# Patient Record
Sex: Female | Born: 1935 | Race: White | Hispanic: No | Smoking: Former smoker
Health system: Southern US, Community
[De-identification: ages and names within clinical notes are randomized; demographics above are authoritative.]

## PROBLEM LIST (undated history)

## (undated) DIAGNOSIS — F3289 Other specified depressive episodes: Secondary | ICD-10-CM

## (undated) DIAGNOSIS — F17200 Nicotine dependence, unspecified, uncomplicated: Secondary | ICD-10-CM

## (undated) DIAGNOSIS — I499 Cardiac arrhythmia, unspecified: Secondary | ICD-10-CM

## (undated) DIAGNOSIS — J441 Chronic obstructive pulmonary disease with (acute) exacerbation: Secondary | ICD-10-CM

## (undated) DIAGNOSIS — E319 Polyglandular dysfunction, unspecified: Secondary | ICD-10-CM

## (undated) DIAGNOSIS — K3189 Other diseases of stomach and duodenum: Secondary | ICD-10-CM

## (undated) DIAGNOSIS — J438 Other emphysema: Secondary | ICD-10-CM

## (undated) DIAGNOSIS — I10 Essential (primary) hypertension: Secondary | ICD-10-CM

## (undated) DIAGNOSIS — F0280 Dementia in other diseases classified elsewhere without behavioral disturbance: Secondary | ICD-10-CM

## (undated) DIAGNOSIS — E785 Hyperlipidemia, unspecified: Secondary | ICD-10-CM

## (undated) DIAGNOSIS — G309 Alzheimer's disease, unspecified: Secondary | ICD-10-CM

## (undated) DIAGNOSIS — F329 Major depressive disorder, single episode, unspecified: Secondary | ICD-10-CM

## (undated) DIAGNOSIS — M1990 Unspecified osteoarthritis, unspecified site: Secondary | ICD-10-CM

## (undated) DIAGNOSIS — I509 Heart failure, unspecified: Secondary | ICD-10-CM

## (undated) DIAGNOSIS — N183 Chronic kidney disease, stage 3 unspecified: Secondary | ICD-10-CM

## (undated) DIAGNOSIS — G4700 Insomnia, unspecified: Secondary | ICD-10-CM

## (undated) DIAGNOSIS — M810 Age-related osteoporosis without current pathological fracture: Secondary | ICD-10-CM

## (undated) DIAGNOSIS — I129 Hypertensive chronic kidney disease with stage 1 through stage 4 chronic kidney disease, or unspecified chronic kidney disease: Secondary | ICD-10-CM

## (undated) DIAGNOSIS — L821 Other seborrheic keratosis: Secondary | ICD-10-CM

## (undated) DIAGNOSIS — J029 Acute pharyngitis, unspecified: Secondary | ICD-10-CM

## (undated) DIAGNOSIS — R269 Unspecified abnormalities of gait and mobility: Secondary | ICD-10-CM

## (undated) DIAGNOSIS — K219 Gastro-esophageal reflux disease without esophagitis: Secondary | ICD-10-CM

## (undated) DIAGNOSIS — R55 Syncope and collapse: Secondary | ICD-10-CM

## (undated) DIAGNOSIS — R1013 Epigastric pain: Secondary | ICD-10-CM

## (undated) DIAGNOSIS — J449 Chronic obstructive pulmonary disease, unspecified: Secondary | ICD-10-CM

## (undated) DIAGNOSIS — J4489 Other specified chronic obstructive pulmonary disease: Secondary | ICD-10-CM

## (undated) DIAGNOSIS — H409 Unspecified glaucoma: Secondary | ICD-10-CM

## (undated) DIAGNOSIS — E876 Hypokalemia: Secondary | ICD-10-CM

## (undated) DIAGNOSIS — I4891 Unspecified atrial fibrillation: Secondary | ICD-10-CM

## (undated) DIAGNOSIS — I959 Hypotension, unspecified: Secondary | ICD-10-CM

## (undated) HISTORY — DX: Acute pharyngitis, unspecified: J02.9

## (undated) HISTORY — DX: Essential (primary) hypertension: I10

## (undated) HISTORY — DX: Hypertensive chronic kidney disease with stage 1 through stage 4 chronic kidney disease, or unspecified chronic kidney disease: I12.9

## (undated) HISTORY — DX: Dementia in other diseases classified elsewhere, unspecified severity, without behavioral disturbance, psychotic disturbance, mood disturbance, and anxiety: F02.80

## (undated) HISTORY — DX: Syncope and collapse: R55

## (undated) HISTORY — DX: Age-related osteoporosis without current pathological fracture: M81.0

## (undated) HISTORY — DX: Chronic obstructive pulmonary disease, unspecified: J44.9

## (undated) HISTORY — DX: Other specified depressive episodes: F32.89

## (undated) HISTORY — DX: Other emphysema: J43.8

## (undated) HISTORY — DX: Chronic obstructive pulmonary disease with (acute) exacerbation: J44.1

## (undated) HISTORY — DX: Cardiac arrhythmia, unspecified: I49.9

## (undated) HISTORY — DX: Insomnia, unspecified: G47.00

## (undated) HISTORY — DX: Unspecified atrial fibrillation: I48.91

## (undated) HISTORY — DX: Other specified chronic obstructive pulmonary disease: J44.89

## (undated) HISTORY — DX: Hypotension, unspecified: I95.9

## (undated) HISTORY — DX: Chronic kidney disease, stage 3 (moderate): N18.3

## (undated) HISTORY — DX: Chronic kidney disease, stage 3 unspecified: N18.30

## (undated) HISTORY — DX: Major depressive disorder, single episode, unspecified: F32.9

## (undated) HISTORY — DX: Epigastric pain: R10.13

## (undated) HISTORY — DX: Unspecified abnormalities of gait and mobility: R26.9

## (undated) HISTORY — DX: Hypokalemia: E87.6

## (undated) HISTORY — DX: Alzheimer's disease, unspecified: G30.9

## (undated) HISTORY — DX: Unspecified glaucoma: H40.9

## (undated) HISTORY — DX: Nicotine dependence, unspecified, uncomplicated: F17.200

## (undated) HISTORY — DX: Other diseases of stomach and duodenum: K31.89

## (undated) HISTORY — DX: Other seborrheic keratosis: L82.1

## (undated) HISTORY — DX: Polyglandular dysfunction, unspecified: E31.9

## (undated) HISTORY — DX: Gastro-esophageal reflux disease without esophagitis: K21.9

## (undated) HISTORY — DX: Hyperlipidemia, unspecified: E78.5

## (undated) HISTORY — DX: Unspecified osteoarthritis, unspecified site: M19.90

## (undated) HISTORY — DX: Heart failure, unspecified: I50.9

---

## 1998-07-18 ENCOUNTER — Ambulatory Visit (HOSPITAL_COMMUNITY): Admission: RE | Admit: 1998-07-18 | Discharge: 1998-07-18 | Payer: Self-pay

## 1998-07-25 ENCOUNTER — Ambulatory Visit (HOSPITAL_COMMUNITY): Admission: RE | Admit: 1998-07-25 | Discharge: 1998-07-25 | Payer: Self-pay

## 1999-07-25 ENCOUNTER — Ambulatory Visit (HOSPITAL_COMMUNITY): Admission: RE | Admit: 1999-07-25 | Discharge: 1999-07-25 | Payer: Self-pay

## 1999-07-30 ENCOUNTER — Ambulatory Visit (HOSPITAL_COMMUNITY): Admission: RE | Admit: 1999-07-30 | Discharge: 1999-07-30 | Payer: Self-pay

## 2000-07-28 ENCOUNTER — Encounter: Admission: RE | Admit: 2000-07-28 | Discharge: 2000-07-28 | Payer: Self-pay | Admitting: Internal Medicine

## 2000-07-28 ENCOUNTER — Encounter: Payer: Self-pay | Admitting: Internal Medicine

## 2001-07-29 ENCOUNTER — Ambulatory Visit (HOSPITAL_COMMUNITY): Admission: RE | Admit: 2001-07-29 | Discharge: 2001-07-29 | Payer: Self-pay | Admitting: *Deleted

## 2002-08-03 ENCOUNTER — Ambulatory Visit (HOSPITAL_COMMUNITY): Admission: RE | Admit: 2002-08-03 | Discharge: 2002-08-03 | Payer: Self-pay | Admitting: Internal Medicine

## 2003-08-05 ENCOUNTER — Ambulatory Visit (HOSPITAL_COMMUNITY): Admission: RE | Admit: 2003-08-05 | Discharge: 2003-08-05 | Payer: Self-pay | Admitting: *Deleted

## 2004-08-06 ENCOUNTER — Ambulatory Visit (HOSPITAL_COMMUNITY): Admission: RE | Admit: 2004-08-06 | Discharge: 2004-08-06 | Payer: Self-pay | Admitting: *Deleted

## 2005-08-08 ENCOUNTER — Ambulatory Visit (HOSPITAL_COMMUNITY): Admission: RE | Admit: 2005-08-08 | Discharge: 2005-08-08 | Payer: Self-pay | Admitting: Internal Medicine

## 2006-08-11 ENCOUNTER — Ambulatory Visit (HOSPITAL_COMMUNITY): Admission: RE | Admit: 2006-08-11 | Discharge: 2006-08-11 | Payer: Self-pay | Admitting: Internal Medicine

## 2007-08-19 ENCOUNTER — Ambulatory Visit (HOSPITAL_COMMUNITY): Admission: RE | Admit: 2007-08-19 | Discharge: 2007-08-19 | Payer: Self-pay | Admitting: Internal Medicine

## 2008-08-22 ENCOUNTER — Ambulatory Visit (HOSPITAL_COMMUNITY): Admission: RE | Admit: 2008-08-22 | Discharge: 2008-08-22 | Payer: Self-pay | Admitting: Internal Medicine

## 2009-08-25 ENCOUNTER — Ambulatory Visit (HOSPITAL_COMMUNITY): Admission: RE | Admit: 2009-08-25 | Discharge: 2009-08-25 | Payer: Self-pay | Admitting: Internal Medicine

## 2010-08-27 ENCOUNTER — Ambulatory Visit (HOSPITAL_COMMUNITY): Admission: RE | Admit: 2010-08-27 | Discharge: 2010-08-27 | Payer: Self-pay | Admitting: Internal Medicine

## 2011-01-19 ENCOUNTER — Other Ambulatory Visit (HOSPITAL_COMMUNITY): Payer: Self-pay | Admitting: Internal Medicine

## 2011-01-19 DIAGNOSIS — Z1231 Encounter for screening mammogram for malignant neoplasm of breast: Secondary | ICD-10-CM

## 2011-01-19 DIAGNOSIS — Z0000 Encounter for general adult medical examination without abnormal findings: Secondary | ICD-10-CM

## 2011-08-29 ENCOUNTER — Ambulatory Visit (HOSPITAL_COMMUNITY): Payer: Self-pay

## 2011-09-05 ENCOUNTER — Ambulatory Visit (HOSPITAL_COMMUNITY): Admission: RE | Admit: 2011-09-05 | Payer: PRIVATE HEALTH INSURANCE | Source: Ambulatory Visit

## 2012-11-05 ENCOUNTER — Emergency Department: Payer: Self-pay | Admitting: Emergency Medicine

## 2012-11-05 LAB — URINALYSIS, COMPLETE
Glucose,UR: NEGATIVE mg/dL (ref 0–75)
Ketone: NEGATIVE
Leukocyte Esterase: NEGATIVE
Nitrite: NEGATIVE
Ph: 5 (ref 4.5–8.0)
Protein: NEGATIVE
RBC,UR: 2 /HPF (ref 0–5)
Specific Gravity: 1.026 (ref 1.003–1.030)
Squamous Epithelial: 1
WBC UR: 3 /HPF (ref 0–5)

## 2012-11-05 LAB — COMPREHENSIVE METABOLIC PANEL
Albumin: 3.3 g/dL — ABNORMAL LOW (ref 3.4–5.0)
Anion Gap: 7 (ref 7–16)
Calcium, Total: 9.5 mg/dL (ref 8.5–10.1)
Chloride: 101 mmol/L (ref 98–107)
EGFR (African American): 37 — ABNORMAL LOW
SGOT(AST): 22 U/L (ref 15–37)
SGPT (ALT): 18 U/L (ref 12–78)
Total Protein: 7.1 g/dL (ref 6.4–8.2)

## 2012-11-05 LAB — CBC
HCT: 34.2 % — ABNORMAL LOW (ref 35.0–47.0)
HGB: 11.3 g/dL — ABNORMAL LOW (ref 12.0–16.0)
MCHC: 33.1 g/dL (ref 32.0–36.0)
Platelet: 196 10*3/uL (ref 150–440)
RDW: 13.9 % (ref 11.5–14.5)
WBC: 7.2 10*3/uL (ref 3.6–11.0)

## 2013-02-02 ENCOUNTER — Emergency Department: Payer: Self-pay | Admitting: Emergency Medicine

## 2013-02-02 LAB — CBC WITH DIFFERENTIAL/PLATELET
Comment - H1-Com1: NORMAL
HCT: 34.6 % — ABNORMAL LOW (ref 35.0–47.0)
HGB: 11.3 g/dL — ABNORMAL LOW (ref 12.0–16.0)
Lymphocytes: 13 %
MCH: 32.1 pg (ref 26.0–34.0)
MCHC: 32.6 g/dL (ref 32.0–36.0)
MCV: 98 fL (ref 80–100)
Monocytes: 17 %
Platelet: 185 10*3/uL (ref 150–440)
RBC: 3.51 10*6/uL — ABNORMAL LOW (ref 3.80–5.20)
RDW: 14.1 % (ref 11.5–14.5)
Variant Lymphocyte - H1-Rlymph: 2 %
WBC: 8.1 10*3/uL (ref 3.6–11.0)

## 2013-02-02 LAB — COMPREHENSIVE METABOLIC PANEL
Albumin: 3.7 g/dL (ref 3.4–5.0)
Alkaline Phosphatase: 60 U/L (ref 50–136)
Anion Gap: 6 — ABNORMAL LOW (ref 7–16)
Bilirubin,Total: 0.5 mg/dL (ref 0.2–1.0)
Calcium, Total: 9.1 mg/dL (ref 8.5–10.1)
Co2: 27 mmol/L (ref 21–32)
EGFR (African American): 34 — ABNORMAL LOW
Osmolality: 288 (ref 275–301)
Potassium: 3.9 mmol/L (ref 3.5–5.1)
SGOT(AST): 28 U/L (ref 15–37)
SGPT (ALT): 18 U/L (ref 12–78)
Sodium: 138 mmol/L (ref 136–145)

## 2013-02-02 LAB — URINALYSIS, COMPLETE
Glucose,UR: NEGATIVE mg/dL (ref 0–75)
Hyaline Cast: 3
Ketone: NEGATIVE
Nitrite: NEGATIVE
Ph: 5 (ref 4.5–8.0)
Protein: NEGATIVE
RBC,UR: 5 /HPF (ref 0–5)
Squamous Epithelial: 3
WBC UR: 3 /HPF (ref 0–5)

## 2013-02-02 LAB — TROPONIN I: Troponin-I: <0.02 ng/mL

## 2013-08-25 ENCOUNTER — Emergency Department: Payer: Self-pay | Admitting: Emergency Medicine

## 2013-08-25 LAB — CBC WITH DIFFERENTIAL/PLATELET
Basophil #: 0.1 10*3/uL (ref 0.0–0.1)
Basophil %: 0.9 %
Eosinophil %: 3.8 %
HGB: 10.4 g/dL — ABNORMAL LOW (ref 12.0–16.0)
Lymphocyte #: 1.2 10*3/uL (ref 1.0–3.6)
Lymphocyte %: 20.3 %
MCH: 33.2 pg (ref 26.0–34.0)
MCV: 95 fL (ref 80–100)
Monocyte #: 1 x10 3/mm — ABNORMAL HIGH (ref 0.2–0.9)
Neutrophil #: 3.5 10*3/uL (ref 1.4–6.5)
Neutrophil %: 58.3 %
Platelet: 165 10*3/uL (ref 150–440)
RBC: 3.14 10*6/uL — ABNORMAL LOW (ref 3.80–5.20)

## 2013-08-25 LAB — URINALYSIS, COMPLETE
Bacteria: NONE SEEN
Blood: NEGATIVE
Glucose,UR: NEGATIVE mg/dL (ref 0–75)
Hyaline Cast: 22
Ketone: NEGATIVE
Ph: 5 (ref 4.5–8.0)
RBC,UR: 2 /HPF (ref 0–5)
WBC UR: 5 /HPF (ref 0–5)

## 2013-08-25 LAB — COMPREHENSIVE METABOLIC PANEL
Albumin: 3.2 g/dL — ABNORMAL LOW (ref 3.4–5.0)
Anion Gap: 5 — ABNORMAL LOW (ref 7–16)
BUN: 36 mg/dL — ABNORMAL HIGH (ref 7–18)
Calcium, Total: 9 mg/dL (ref 8.5–10.1)
Co2: 29 mmol/L (ref 21–32)
EGFR (African American): 29 — ABNORMAL LOW
EGFR (Non-African Amer.): 25 — ABNORMAL LOW
Glucose: 107 mg/dL — ABNORMAL HIGH (ref 65–99)
Potassium: 3.8 mmol/L (ref 3.5–5.1)
SGPT (ALT): 18 U/L (ref 12–78)
Sodium: 137 mmol/L (ref 136–145)
Total Protein: 6.5 g/dL (ref 6.4–8.2)

## 2013-08-25 LAB — TROPONIN I: Troponin-I: 0.03 ng/mL

## 2013-08-25 LAB — CK TOTAL AND CKMB (NOT AT ARMC): CK-MB: 1.5 ng/mL (ref 0.5–3.6)

## 2013-10-30 ENCOUNTER — Emergency Department: Payer: Self-pay | Admitting: Emergency Medicine

## 2013-10-30 LAB — URINALYSIS, COMPLETE
Bilirubin,UR: NEGATIVE
Blood: NEGATIVE
Ph: 5 (ref 4.5–8.0)
Protein: NEGATIVE
Specific Gravity: 1.013 (ref 1.003–1.030)
WBC UR: 107 /HPF (ref 0–5)

## 2013-10-30 LAB — COMPREHENSIVE METABOLIC PANEL
Albumin: 3.5 g/dL (ref 3.4–5.0)
Alkaline Phosphatase: 80 U/L (ref 50–136)
BUN: 54 mg/dL — ABNORMAL HIGH (ref 7–18)
Bilirubin,Total: 0.5 mg/dL (ref 0.2–1.0)
Calcium, Total: 9.4 mg/dL (ref 8.5–10.1)
Chloride: 101 mmol/L (ref 98–107)
EGFR (Non-African Amer.): 20 — ABNORMAL LOW
Glucose: 130 mg/dL — ABNORMAL HIGH (ref 65–99)
Osmolality: 288 (ref 275–301)
Potassium: 3.4 mmol/L — ABNORMAL LOW (ref 3.5–5.1)
SGOT(AST): 29 U/L (ref 15–37)
SGPT (ALT): 15 U/L (ref 12–78)
Sodium: 136 mmol/L (ref 136–145)
Total Protein: 7 g/dL (ref 6.4–8.2)

## 2013-10-30 LAB — CBC
MCH: 33 pg (ref 26.0–34.0)
MCHC: 34.5 g/dL (ref 32.0–36.0)
Platelet: 168 10*3/uL (ref 150–440)
RBC: 3.36 10*6/uL — ABNORMAL LOW (ref 3.80–5.20)
RDW: 12.8 % (ref 11.5–14.5)
WBC: 7 10*3/uL (ref 3.6–11.0)

## 2013-11-01 ENCOUNTER — Encounter: Payer: Self-pay | Admitting: Cardiovascular Disease

## 2013-11-01 ENCOUNTER — Ambulatory Visit (INDEPENDENT_AMBULATORY_CARE_PROVIDER_SITE_OTHER): Payer: PRIVATE HEALTH INSURANCE | Admitting: Cardiovascular Disease

## 2013-11-01 VITALS — BP 104/54 | HR 58 | Ht 68.0 in | Wt 163.0 lb

## 2013-11-01 DIAGNOSIS — R42 Dizziness and giddiness: Secondary | ICD-10-CM | POA: Insufficient documentation

## 2013-11-01 DIAGNOSIS — N289 Disorder of kidney and ureter, unspecified: Secondary | ICD-10-CM

## 2013-11-01 DIAGNOSIS — R0602 Shortness of breath: Secondary | ICD-10-CM

## 2013-11-01 DIAGNOSIS — F0390 Unspecified dementia without behavioral disturbance: Secondary | ICD-10-CM | POA: Insufficient documentation

## 2013-11-01 DIAGNOSIS — I10 Essential (primary) hypertension: Secondary | ICD-10-CM | POA: Insufficient documentation

## 2013-11-01 NOTE — Assessment & Plan Note (Signed)
Currently on Exelon patch.

## 2013-11-01 NOTE — Assessment & Plan Note (Signed)
Creatinine 1.7 in August consistent with mild dehydration. Worse this past week. Suggest we use diuretics including chlorthalidone sparingly.

## 2013-11-01 NOTE — Assessment & Plan Note (Signed)
Blood pressure is low today. I suspect she continues to be dehydrated given recent creatinine greater than 2 and hospital evaluation requiring IV fluids.  For blood pressure, would hold off on any medications at this time. Perhaps starting next week, We have suggested she only take her out on 12.5 mg every other day when necessary for leg edema. Would take low-dose losartan 25 mg daily. Losartan could be slowly titrated upwards as needed for blood pressure control. She does not drink much fluids per the family. We'll need to proceed with diuretics very cautiously.

## 2013-11-01 NOTE — Assessment & Plan Note (Signed)
Son reports significant dizziness. Blood pressure is low today. 95 systolic on my check sitting. Likely lower with standing. Suspect this is from dehydration.

## 2013-11-01 NOTE — Progress Notes (Signed)
Patient ID: Evelyn Taylor, female    DOB: January 12, 1921, 79 y.o.   MRN: [id number]  HPI Comments: Evelyn Taylor is a very pleasant 79 year old woman who presents from [facility] with a history of hypertension, long history of smoking from age 42 to age 44, dementia, he uses oxygen, who presents by referral for evaluation of her blood pressure.  Her son resents with her today and provides most of the history. He states that her blood pressure has been difficult to control. She has had several evaluations in the emergency room for dehydration and confusion. Most recent evaluation 10/30/2013. She was confused, creatinine 2.16, BUN 54. Potassium 3.4. She was given IV fluids, mental status improved and she was sent home. Hematocrit 32 at that time  Previous evaluation in the emergency room 08/25/2013. At that time creatinine 1.79, BUN 36. Again she was given IV fluids with improvement of her symptoms. Hematocrit 30 at that time  She takes losartan 50 mg daily with chlorthalidone 25 mg daily Her son reports that she does not drink very much fluid today she denies having any significant symptoms. Reports that she feels well. Legs are weak.   Notes from her primary care physician, Evelyn D. tablet she has a history of leg edema for which chlorthalidone was being used. Notes indicate systolic pressures sometimes 140 up to 160s. Blood pressures will drop after her morning medications to 80 up to 120 systolic. She does have underlying COPD Prior echocardiogram showing normal LV systolic function ejection fraction 60%, moderate TR, biventricular systolic pressure 40-45 mm mercury  EKG today shows sinus bradycardia with rate 58 beats per minute, poor R-wave progression through the anterior precordial leads, left axis deviation   Outpatient Encounter Prescriptions as of 11/01/2013  Medication Sig  . albuterol (PROVENTIL HFA;VENTOLIN HFA) 108 (90 BASE) MCG/ACT inhaler Inhale 2 puffs into the lungs every  4 (four) hours as needed for wheezing.  Marland Kitchen alendronate (FOSAMAX) 70 MG tablet Take 70 mg by mouth every 7 (seven) days. Take with a full glass of water on an empty stomach.  Marland Kitchen atorvastatin (LIPITOR) 40 MG tablet Take 40 mg by mouth daily.  . brimonidine (ALPHAGAN) 0.15 % ophthalmic solution Apply 1 drop to eye 3 (three) times daily.  . Calcium Carbonate-Vitamin D 600-400 MG-UNIT per tablet Take 1 tablet by mouth daily.  . chlorthalidone (HYGROTON) 25 MG tablet Take 25 mg by mouth daily.  . ciprofloxacin (CIPRO) 500 MG tablet Take 500 mg by mouth 2 (two) times daily.  . citalopram (CELEXA) 20 MG tablet Take 20 mg by mouth daily.  . ferrous sulfate 325 (65 FE) MG tablet Take 325 mg by mouth 3 (three) times daily with meals.  . Fluticasone-Salmeterol (ADVAIR DISKUS) 250-50 MCG/DOSE AEPB Inhale 1 puff into the lungs every 12 (twelve) hours.  Marland Kitchen guaiFENesin (ROBITUSSIN) 100 MG/5ML liquid Take 200 mg by mouth 4 (four) times daily as needed for cough.  . losartan (COZAAR) 50 MG tablet Take 50 mg by mouth daily.  Marland Kitchen morphine (MSIR) 15 MG tablet Take 15 mg by mouth as needed for pain.  Marland Kitchen omeprazole (PRILOSEC) 20 MG capsule Take 20 mg by mouth daily.  . rivastigmine (EXELON) 4.6 mg/24hr Place 1 patch onto the skin daily.  Marland Kitchen tiotropium (SPIRIVA HANDIHALER) 18 MCG inhalation capsule Place 18 mcg into inhaler and inhale daily.    Review of Systems  Constitutional: Negative.   HENT: Negative.   Eyes: Negative.   Respiratory: Negative.   Cardiovascular: Negative.  Gastrointestinal: Negative.   Endocrine: Negative.   Musculoskeletal: Negative.   Skin: Negative.   Allergic/Immunologic: Negative.   Neurological: Negative.   Hematological: Negative.   Psychiatric/Behavioral: Negative.   All other systems reviewed and are negative.    BP 104/54  Pulse 58  Ht 5\' 8"  (1.727 m)  Wt 163 lb (73.936 kg)  BMI 24.79 kg/m2  Physical Exam  Nursing note and vitals reviewed. Constitutional: She is oriented  to person, place, and time. She appears well-developed and well-nourished.  HENT:  Head: Normocephalic.  Nose: Nose normal.  Mouth/Throat: Oropharynx is clear and moist.  Eyes: Conjunctivae are normal. Pupils are equal, round, and reactive to light.  Neck: Normal range of motion. Neck supple. No JVD present.  Cardiovascular: Normal rate, regular rhythm, S1 normal, S2 normal, normal heart sounds and intact distal pulses.  Exam reveals no gallop and no friction rub.   No murmur heard. Pulmonary/Chest: Effort normal and breath sounds normal. No respiratory distress. She has no wheezes. She has no rales. She exhibits no tenderness.  Abdominal: Soft. Bowel sounds are normal. She exhibits no distension. There is no tenderness.  Musculoskeletal: Normal range of motion. She exhibits no edema and no tenderness.  Lymphadenopathy:    She has no cervical adenopathy.  Neurological: She is alert and oriented to person, place, and time. Coordination normal.  Skin: Skin is warm and dry. No rash noted. No erythema.  Psychiatric: She has a normal mood and affect. Her behavior is normal. Judgment and thought content normal.    Assessment and Plan

## 2013-11-01 NOTE — Patient Instructions (Signed)
Recent lab work suggests you were recently dehydrated Blood pressure is low today, 100/50 Please stop losartan and chlorthalidone for one week  Starting 11/08/13: Please restart losartan 25 mg po daily, hold for SBP <140 Please restart chlorthalidone 12.5 mg every other day PRN, only for leg swelling, weight gain  Please call us if you have new issues that need to be addressed before your next appt.  Your physician wants you to follow-up in: 1 month.

## 2013-11-15 ENCOUNTER — Emergency Department: Payer: Self-pay | Admitting: Emergency Medicine

## 2013-11-15 LAB — CK TOTAL AND CKMB (NOT AT ARMC)
CK, Total: 49 U/L (ref 21–215)
CK-MB: 0.9 ng/mL (ref 0.5–3.6)

## 2013-11-15 LAB — CBC
HCT: 28.8 % — ABNORMAL LOW (ref 35.0–47.0)
HGB: 9.9 g/dL — ABNORMAL LOW (ref 12.0–16.0)
MCH: 33 pg (ref 26.0–34.0)
MCHC: 34.4 g/dL (ref 32.0–36.0)
MCV: 96 fL (ref 80–100)
Platelet: 143 10*3/uL — ABNORMAL LOW (ref 150–440)
RBC: 3 10*6/uL — ABNORMAL LOW (ref 3.80–5.20)
RDW: 12.6 % (ref 11.5–14.5)
WBC: 5.4 10*3/uL (ref 3.6–11.0)

## 2013-11-15 LAB — COMPREHENSIVE METABOLIC PANEL
Albumin: 3 g/dL — ABNORMAL LOW (ref 3.4–5.0)
Alkaline Phosphatase: 72 U/L (ref 50–136)
Anion Gap: 6 — ABNORMAL LOW (ref 7–16)
BUN: 26 mg/dL — ABNORMAL HIGH (ref 7–18)
Chloride: 103 mmol/L (ref 98–107)
Co2: 30 mmol/L (ref 21–32)
EGFR (Non-African Amer.): 33 — ABNORMAL LOW
Glucose: 161 mg/dL — ABNORMAL HIGH (ref 65–99)
Osmolality: 286 (ref 275–301)
Potassium: 3.1 mmol/L — ABNORMAL LOW (ref 3.5–5.1)
SGOT(AST): 20 U/L (ref 15–37)
SGPT (ALT): 19 U/L (ref 12–78)
Total Protein: 5.8 g/dL — ABNORMAL LOW (ref 6.4–8.2)

## 2013-11-15 LAB — URINALYSIS, COMPLETE
Bilirubin,UR: NEGATIVE
Glucose,UR: NEGATIVE mg/dL (ref 0–75)
Hyaline Cast: 15
Ketone: NEGATIVE
Nitrite: NEGATIVE
Ph: 6 (ref 4.5–8.0)
Protein: 30
RBC,UR: 1 /HPF (ref 0–5)
WBC UR: 194 /HPF (ref 0–5)

## 2013-11-15 LAB — TROPONIN I: Troponin-I: 0.03 ng/mL

## 2013-11-17 LAB — URINE CULTURE

## 2013-12-01 ENCOUNTER — Encounter: Payer: Self-pay | Admitting: Cardiovascular Disease

## 2013-12-01 ENCOUNTER — Ambulatory Visit (INDEPENDENT_AMBULATORY_CARE_PROVIDER_SITE_OTHER): Payer: PRIVATE HEALTH INSURANCE | Admitting: Cardiovascular Disease

## 2013-12-01 VITALS — BP 104/67 | HR 55 | Wt 168.5 lb

## 2013-12-01 DIAGNOSIS — E860 Dehydration: Secondary | ICD-10-CM | POA: Insufficient documentation

## 2013-12-01 DIAGNOSIS — I951 Orthostatic hypotension: Secondary | ICD-10-CM | POA: Insufficient documentation

## 2013-12-01 DIAGNOSIS — N289 Disorder of kidney and ureter, unspecified: Secondary | ICD-10-CM

## 2013-12-01 DIAGNOSIS — F0390 Unspecified dementia without behavioral disturbance: Secondary | ICD-10-CM

## 2013-12-01 DIAGNOSIS — R42 Dizziness and giddiness: Secondary | ICD-10-CM

## 2013-12-01 NOTE — Assessment & Plan Note (Signed)
Blood pressure continues to run rate low. Recent blood work 11/15/2013 improved from several weeks prior but still mild dehydration. Encouraged oral fluids, we'll stop chlorthalidone, stop losartan

## 2013-12-01 NOTE — Patient Instructions (Signed)
Increase fluid , 2 more glasses of ice tea per day Hold losartan  Hold chlorthalidone  Ok to fax over the blood pressure numbers and weight before next appt.   Please call us if you have new issues that need to be addressed before your next appt.  Your physician wants you to follow-up in: end of december

## 2013-12-01 NOTE — Progress Notes (Signed)
Patient ID: Evelyn Taylor, female    DOB: [birth year], 79 y.o.   MRN: [id number]  HPI Comments: Evelyn Taylor is a very pleasant 79 year old woman who presents from [facility] with a history of hypertension, long history of smoking from age 31 to age 40, dementia,  on oxygen, who presents for routine followup.  Evaluated in the hospital at the beginning of November 2014 for dehydration.  Reevaluated again in the emergency room November 17 for hypotension and dehydration.  On her last clinic visit, chlorthalidone was changed to every other day, losartan was decreased to 25 mg daily down from 50 mg daily. On this regimen, she continued to have low blood pressures, was reevaluated in the ER 11/15/2013. Chlorthalidone was changed to when necessary only. It does not appear that any other medication changes were made. Notes indicate that she continues to have low blood pressures. Documentation with her today suggests systolic pressure in the 70s today at lunchtime. She is essentially nonverbal on her visit today. She presents with her son and a member of the staff from [facility]. At baseline she does not drink much fluids. She sleeps some of the day. Uncertain how much she sleeps at nighttime.  Blood pressure in the office is 98/50, with standing blood pressure 94/59, repeat 104/67, heart rates in the low to mid 50s  Previous evaluation in the emergency room 08/25/2013. At that time creatinine 1.79, BUN 36.  Repeat evaluation in the ER for dehydration 10/30/2013 Repeat evaluation in the ER for dehydration a member 17 2014 Work on 10/30/2013 shows creatinine 2.16, BUN 54, potassium 3.4 Blood work 11/15/2013 shows creatinine 1.39, BUN 26, potassium 3.1  Prior echocardiogram showing normal LV systolic function ejection fraction 60%, moderate TR, biventricular systolic pressure 40-45 mm mercury  EKG today shows sinus bradycardia with rate 53 and beats per minute, nonspecific ST abnormality  anterolateral leads, inferior leads  Outpatient Encounter Prescriptions as of 12/01/2013  Medication Sig  . albuterol (PROVENTIL HFA;VENTOLIN HFA) 108 (90 BASE) MCG/ACT inhaler Inhale 2 puffs into the lungs every 4 (four) hours as needed for wheezing.  Marland Kitchen alendronate (FOSAMAX) 70 MG tablet Take 70 mg by mouth every 7 (seven) days. Take with a full glass of water on an empty stomach.  Marland Kitchen atorvastatin (LIPITOR) 40 MG tablet Take 40 mg by mouth daily.  . brimonidine (ALPHAGAN) 0.15 % ophthalmic solution Apply 1 drop to eye 3 (three) times daily.  . Calcium Carbonate-Vitamin D 600-400 MG-UNIT per tablet Take 1 tablet by mouth daily.  . chlorthalidone (HYGROTON) 25 MG tablet Take 12.5 mg by mouth every other day.   . citalopram (CELEXA) 20 MG tablet Take 20 mg by mouth daily.  . ferrous sulfate 325 (65 FE) MG tablet Take 325 mg by mouth 3 (three) times daily with meals.  . Fluticasone-Salmeterol (ADVAIR DISKUS) 250-50 MCG/DOSE AEPB Inhale 1 puff into the lungs every 12 (twelve) hours.  Marland Kitchen losartan (COZAAR) 50 MG tablet Take 25 mg by mouth daily.   Marland Kitchen morphine (MSIR) 15 MG tablet Take 15 mg by mouth as needed for pain.  Marland Kitchen omeprazole (PRILOSEC) 20 MG capsule Take 20 mg by mouth daily.  . rivastigmine (EXELON) 4.6 mg/24hr Place 1 patch onto the skin daily.  Marland Kitchen tiotropium (SPIRIVA HANDIHALER) 18 MCG inhalation capsule Place 18 mcg into inhaler and inhale daily.  . [DISCONTINUED] ciprofloxacin (CIPRO) 500 MG tablet Take 500 mg by mouth 2 (two) times daily.  . [DISCONTINUED] guaiFENesin (ROBITUSSIN) 100 MG/5ML liquid Take  200 mg by mouth 4 (four) times daily as needed for cough.    Review of Systems  Constitutional: Negative.   HENT: Negative.   Eyes: Negative.   Respiratory: Negative.   Cardiovascular: Negative.   Gastrointestinal: Negative.   Endocrine: Negative.   Musculoskeletal: Negative.   Skin: Negative.   Allergic/Immunologic: Negative.   Neurological: Negative.   Hematological: Negative.    Psychiatric/Behavioral: Negative.   All other systems reviewed and are negative.    BP 104/67  Pulse 55  Wt 168 lb 8 oz (76.431 kg)  SpO2 96%  Physical Exam  Nursing note and vitals reviewed. Constitutional: She is oriented to person, place, and time. She appears well-developed and well-nourished.  HENT:  Head: Normocephalic.  Nose: Nose normal.  Mouth/Throat: Oropharynx is clear and moist.  Eyes: Conjunctivae are normal. Pupils are equal, round, and reactive to light.  Neck: Normal range of motion. Neck supple. No JVD present.  Cardiovascular: Normal rate, regular rhythm, S1 normal, S2 normal, normal heart sounds and intact distal pulses.  Exam reveals no gallop and no friction rub.   No murmur heard. Pulmonary/Chest: Effort normal and breath sounds normal. No respiratory distress. She has no wheezes. She has no rales. She exhibits no tenderness.  Abdominal: Soft. Bowel sounds are normal. She exhibits no distension. There is no tenderness.  Musculoskeletal: Normal range of motion. She exhibits no edema and no tenderness.  Lymphadenopathy:    She has no cervical adenopathy.  Neurological: She is alert and oriented to person, place, and time. Coordination normal.  Skin: Skin is warm and dry. No rash noted. No erythema.  Psychiatric: She has a normal mood and affect. Her behavior is normal. Judgment and thought content normal.    Assessment and Plan

## 2013-12-01 NOTE — Assessment & Plan Note (Signed)
Some improvement in her renal function by holding chlorthalidone. Encouraged oral fluids

## 2013-12-01 NOTE — Assessment & Plan Note (Addendum)
We will hold chlorthalidone. Encouraged two extra glasses of ice tea per day

## 2013-12-01 NOTE — Assessment & Plan Note (Signed)
Not able to provide a good history on today's visit

## 2013-12-02 ENCOUNTER — Telehealth: Payer: Self-pay

## 2013-12-02 NOTE — Telephone Encounter (Signed)
Spoke w/ Toni Amend.  She reports that pt was instructed to hold losartan if systolic pressures were less than 140. Reports "we gave it to her a few days when we probably shouldn't have" that pressures were in 130s, but that there was no significant drop after giving losartan. She is concerned that losartan was d/c'd on yesterday's visit based on these numbers. Instructed her to monitor BP and let me know if numbers change or remain the same.  She is agreeable to this and will call with any questions or concerns.

## 2013-12-02 NOTE — Telephone Encounter (Signed)
Wants to talk about pt medications. Please call 

## 2014-05-19 ENCOUNTER — Emergency Department: Payer: Self-pay | Admitting: Emergency Medicine

## 2014-10-05 ENCOUNTER — Observation Stay: Payer: Self-pay | Admitting: Internal Medicine

## 2014-10-05 LAB — CBC WITH DIFFERENTIAL/PLATELET
Basophil #: 0 10*3/uL (ref 0.0–0.1)
Basophil %: 0.6 %
EOS PCT: 2.9 %
Eosinophil #: 0.2 10*3/uL (ref 0.0–0.7)
HCT: 40.1 % (ref 35.0–47.0)
HGB: 12.9 g/dL (ref 12.0–16.0)
LYMPHS ABS: 1.3 10*3/uL (ref 1.0–3.6)
LYMPHS PCT: 17.2 %
MCH: 31.5 pg (ref 26.0–34.0)
MCHC: 32.2 g/dL (ref 32.0–36.0)
MCV: 98 fL (ref 80–100)
MONO ABS: 1 x10 3/mm — AB (ref 0.2–0.9)
Monocyte %: 12.7 %
NEUTROS ABS: 5.1 10*3/uL (ref 1.4–6.5)
Neutrophil %: 66.6 %
Platelet: 170 10*3/uL (ref 150–440)
RBC: 4.1 10*6/uL (ref 3.80–5.20)
RDW: 13.4 % (ref 11.5–14.5)
WBC: 7.6 10*3/uL (ref 3.6–11.0)

## 2014-10-05 LAB — CK TOTAL AND CKMB (NOT AT ARMC)
CK, Total: 79 U/L
CK, Total: 86 U/L
CK, Total: 81 U/L
CK-MB: 1 ng/mL (ref 0.5–3.6)
CK-MB: 1.6 ng/mL (ref 0.5–3.6)
CK-MB: 2.5 ng/mL (ref 0.5–3.6)

## 2014-10-05 LAB — URINALYSIS, COMPLETE
Bilirubin,UR: NEGATIVE
Blood: NEGATIVE
Glucose,UR: NEGATIVE mg/dL (ref 0–75)
KETONE: NEGATIVE
NITRITE: NEGATIVE
PH: 6 (ref 4.5–8.0)
PROTEIN: NEGATIVE
Specific Gravity: 1.019 (ref 1.003–1.030)

## 2014-10-05 LAB — BASIC METABOLIC PANEL
ANION GAP: 5 — AB (ref 7–16)
BUN: 22 mg/dL — ABNORMAL HIGH (ref 7–18)
CALCIUM: 9.2 mg/dL (ref 8.5–10.1)
Chloride: 106 mmol/L (ref 98–107)
Co2: 29 mmol/L (ref 21–32)
Creatinine: 1.29 mg/dL (ref 0.60–1.30)
EGFR (African American): 50 — ABNORMAL LOW
GFR CALC NON AF AMER: 41 — AB
Glucose: 118 mg/dL — ABNORMAL HIGH (ref 65–99)
Osmolality: 284 (ref 275–301)
POTASSIUM: 4.7 mmol/L (ref 3.5–5.1)
SODIUM: 140 mmol/L (ref 136–145)

## 2014-10-05 LAB — TROPONIN I
Troponin-I: 0.03 ng/mL
Troponin-I: 0.04 ng/mL
Troponin-I: 0.35 ng/mL — ABNORMAL HIGH

## 2014-10-05 LAB — TSH: THYROID STIMULATING HORM: 7.12 u[IU]/mL — AB

## 2014-10-06 LAB — BASIC METABOLIC PANEL
ANION GAP: 7 (ref 7–16)
BUN: 24 mg/dL — AB (ref 7–18)
CO2: 28 mmol/L (ref 21–32)
Calcium, Total: 8.8 mg/dL (ref 8.5–10.1)
Chloride: 106 mmol/L (ref 98–107)
Creatinine: 1.04 mg/dL (ref 0.60–1.30)
EGFR (African American): >60
GFR CALC NON AF AMER: 53 — AB
Glucose: 129 mg/dL — ABNORMAL HIGH (ref 65–99)
OSMOLALITY: 287 (ref 275–301)
Potassium: 3.9 mmol/L (ref 3.5–5.1)
Sodium: 141 mmol/L (ref 136–145)

## 2014-10-06 LAB — CBC WITH DIFFERENTIAL/PLATELET
BASOS ABS: 0 10*3/uL (ref 0.0–0.1)
Basophil %: 0.6 %
Eosinophil #: 0.3 10*3/uL (ref 0.0–0.7)
Eosinophil %: 3.9 %
HCT: 36.8 % (ref 35.0–47.0)
HGB: 11.9 g/dL — ABNORMAL LOW (ref 12.0–16.0)
LYMPHS ABS: 1.6 10*3/uL (ref 1.0–3.6)
LYMPHS PCT: 24.6 %
MCH: 31.6 pg (ref 26.0–34.0)
MCHC: 32.4 g/dL (ref 32.0–36.0)
MCV: 98 fL (ref 80–100)
Monocyte #: 0.8 x10 3/mm (ref 0.2–0.9)
Monocyte %: 11.8 %
Neutrophil #: 4 10*3/uL (ref 1.4–6.5)
Neutrophil %: 59.1 %
Platelet: 157 10*3/uL (ref 150–440)
RBC: 3.77 10*6/uL — ABNORMAL LOW (ref 3.80–5.20)
RDW: 13.8 % (ref 11.5–14.5)
WBC: 6.7 10*3/uL (ref 3.6–11.0)

## 2014-10-06 LAB — APTT: Activated PTT: >160 secs (ref 23.6–35.9)

## 2014-10-06 LAB — PROTIME-INR
INR: 1.2
Prothrombin Time: 14.7 secs (ref 11.5–14.7)

## 2014-10-06 LAB — TROPONIN I: TROPONIN-I: 0.7 ng/mL — AB

## 2014-10-06 LAB — CK-MB
CK-MB: 2.8 ng/mL (ref 0.5–3.6)
CK-MB: 3 ng/mL (ref 0.5–3.6)

## 2014-10-10 ENCOUNTER — Ambulatory Visit: Payer: PRIVATE HEALTH INSURANCE | Admitting: Cardiovascular Disease

## 2014-10-25 ENCOUNTER — Ambulatory Visit (INDEPENDENT_AMBULATORY_CARE_PROVIDER_SITE_OTHER): Payer: PRIVATE HEALTH INSURANCE | Admitting: Cardiovascular Disease

## 2014-10-25 ENCOUNTER — Encounter: Payer: Self-pay | Admitting: Cardiovascular Disease

## 2014-10-25 VITALS — BP 130/82 | HR 54 | Ht 66.5 in | Wt 170.5 lb

## 2014-10-25 DIAGNOSIS — E860 Dehydration: Secondary | ICD-10-CM

## 2014-10-25 DIAGNOSIS — F0390 Unspecified dementia without behavioral disturbance: Secondary | ICD-10-CM

## 2014-10-25 DIAGNOSIS — I10 Essential (primary) hypertension: Secondary | ICD-10-CM

## 2014-10-25 DIAGNOSIS — I480 Paroxysmal atrial fibrillation: Secondary | ICD-10-CM

## 2014-10-25 DIAGNOSIS — I4891 Unspecified atrial fibrillation: Secondary | ICD-10-CM

## 2014-10-25 DIAGNOSIS — I482 Chronic atrial fibrillation, unspecified: Secondary | ICD-10-CM | POA: Insufficient documentation

## 2014-10-25 NOTE — Progress Notes (Signed)
Patient ID: Evelyn Taylor, female    DOB: 06/28/1921, 80 y.o.   MRN: [id number]  HPI Comments: Evelyn Taylor is a very pleasant 80 year old woman who [facility] with a history of hypertension, long history of smoking from age 80 to age 80, dementia,  on oxygen, who presents for routine followup.  Evaluated in the hospital at the beginning of November 2014 for dehydration.  Reevaluated again in the emergency room November 15, 2013 for hypotension and dehydration.  On her last clinic visit, chlorthalidone was held  In followup today, losartan has been held   Recent fall 10/05/2014, found to be bradycardic with heart rates in the low 50s  Given IV fluids in the emergency room, converted to atrial fibrillation . Started on low-dose metoprolol and converted back to normal sinus rhythm .  She's been home now for 3 weeks and denies having any problems. No recent falls, no lightheadedness or dizziness, no tachycardia or palpitations Notes provided from the nursing facility report systolic pressure 130 up to 160  She is essentially nonverbal on her visit today. She presents with her son and a member of [facility] staff [facility].  At baseline she does not drink much fluids. She sleeps some of the day. Uncertain how much she sleeps at nighttime.  Blood pressure in the office is 98/50, with standing blood pressure 94/59, repeat 104/67, heart rates in the low to mid 50s  Previous evaluation in the emergency room 08/25/2013. At that time creatinine 1.79, BUN 36.  Repeat evaluation in the ER for dehydration 10/30/2013 Repeat evaluation in the ER for dehydration a member 17 2014 Work on 10/30/2013 shows creatinine 2.16, BUN 54, potassium 3.4 Blood work 11/15/2013 shows creatinine 1.39, BUN 26, potassium 3.1  Prior echocardiogram showing normal LV systolic function ejection fraction 60%, moderate TR, biventricular systolic pressure 40-45 mm mercury  EKG today shows sinus bradycardia  with rate 53 beats per minute, nonspecific ST abnormality anterolateral leads, lafb  Outpatient Encounter Prescriptions as of 10/25/2014  Medication Sig  . albuterol (PROVENTIL HFA;VENTOLIN HFA) 108 (90 BASE) MCG/ACT inhaler Inhale 2 puffs into the lungs every 4 (four) hours as needed for wheezing.  Marland Kitchen. alendronate (FOSAMAX) 70 MG tablet Take 70 mg by mouth every 7 (seven) days. Take with a full glass of water on an empty stomach.  Marland Kitchen. atorvastatin (LIPITOR) 40 MG tablet Take 40 mg by mouth daily.  . brimonidine (ALPHAGAN) 0.15 % ophthalmic solution Apply 1 drop to eye 3 (three) times daily.  . Calcium Carbonate-Vitamin D 600-400 MG-UNIT per tablet Take 1 tablet by mouth daily.  . chlorthalidone (HYGROTON) 25 MG tablet Take 12.5 mg by mouth every other day.   . citalopram (CELEXA) 20 MG tablet Take 20 mg by mouth daily.  . ferrous sulfate 325 (65 FE) MG tablet Take 325 mg by mouth 3 (three) times daily with meals.  . Fluticasone-Salmeterol (ADVAIR DISKUS) 250-50 MCG/DOSE AEPB Inhale 1 puff into the lungs every 12 (twelve) hours.  Marland Kitchen. losartan (COZAAR) 50 MG tablet Take 25 mg by mouth daily.   Marland Kitchen. morphine (MSIR) 15 MG tablet Take 15 mg by mouth as needed for pain.  Marland Kitchen. omeprazole (PRILOSEC) 20 MG capsule Take 20 mg by mouth daily.  . rivastigmine (EXELON) 4.6 mg/24hr Place 1 patch onto the skin daily.  Marland Kitchen. tiotropium (SPIRIVA HANDIHALER) 18 MCG inhalation capsule Place 18 mcg into inhaler and inhale daily.    Review of Systems  Constitutional: Negative.   HENT: Negative.  Eyes: Negative.   Respiratory: Negative.   Cardiovascular: Negative.   Gastrointestinal: Negative.   Endocrine: Negative.   Musculoskeletal: Negative.   Skin: Negative.   Allergic/Immunologic: Negative.   Neurological: Negative.   Hematological: Negative.   Psychiatric/Behavioral: Negative.   All other systems reviewed and are negative.   BP 130/82  Pulse 54  Ht 5' 6.5" (1.689 m)  Wt 170 lb 8 oz (77.338 kg)  BMI 27.11  kg/m2  Physical Exam  Nursing note and vitals reviewed. Constitutional: She is oriented to person, place, and time. She appears well-developed and well-nourished.  HENT:  Head: Normocephalic.  Nose: Nose normal.  Mouth/Throat: Oropharynx is clear and moist.  Eyes: Conjunctivae are normal. Pupils are equal, round, and reactive to light.  Neck: Normal range of motion. Neck supple. No JVD present.  Cardiovascular: Normal rate, regular rhythm, S1 normal, S2 normal, normal heart sounds and intact distal pulses.  Exam reveals no gallop and no friction rub.   No murmur heard. Pulmonary/Chest: Effort normal and breath sounds normal. No respiratory distress. She has no wheezes. She has no rales. She exhibits no tenderness.  Abdominal: Soft. Bowel sounds are normal. She exhibits no distension. There is no tenderness.  Musculoskeletal: Normal range of motion. She exhibits no edema and no tenderness.  Lymphadenopathy:    She has no cervical adenopathy.  Neurological: She is alert and oriented to person, place, and time. Coordination normal.  Skin: Skin is warm and dry. No rash noted. No erythema.  Psychiatric: She has a normal mood and affect. Her behavior is normal. Judgment and thought content normal.    Assessment and Plan

## 2014-10-25 NOTE — Assessment & Plan Note (Signed)
Stable, poor historian at baseline

## 2014-10-25 NOTE — Assessment & Plan Note (Signed)
She was given IV fluids on her recent hospital admission with improvement of her renal function. Son reports at baseline she does not drink much

## 2014-10-25 NOTE — Patient Instructions (Addendum)
Your next appointment will be scheduled in our new office located at :  [facility]  [address], [address]  [place], [place] [id number]  You are doing well. No medication changes were made.  Please call if you have more pass out spells, or lightheaded spells  Please call us if you have new issues that need to be addressed before your next appt.  Your physician wants you to follow-up in: 3 months.  You will receive a reminder letter in the mail two months in advance. If you don't receive a letter, please call our office to schedule the follow-up appointment.

## 2014-10-25 NOTE — Assessment & Plan Note (Signed)
Blood pressure is well controlled on today's visit. No changes made to the medications. 

## 2014-10-25 NOTE — Assessment & Plan Note (Addendum)
Recent hospital records were reviewed including testing, H&P, discharge summary, lab work .  Brief episode of atrial fibrillation while in the hospital 10/05/2014, likely secondary to bolus of IV fluids. Started on low-dose metoprolol. No further symptoms. Poor candidate for anticoagulation given she is a high fall risk. We'll monitor for now. No changes to her medications

## 2014-11-13 ENCOUNTER — Observation Stay: Payer: Self-pay | Admitting: Internal Medicine

## 2014-11-13 LAB — URINALYSIS, COMPLETE
Bacteria: NONE SEEN
Bilirubin,UR: NEGATIVE
Blood: NEGATIVE
Glucose,UR: NEGATIVE mg/dL (ref 0–75)
Ketone: NEGATIVE
Leukocyte Esterase: NEGATIVE
NITRITE: NEGATIVE
Ph: 7 (ref 4.5–8.0)
Protein: NEGATIVE
SPECIFIC GRAVITY: 1.016 (ref 1.003–1.030)
WBC UR: <1 /HPF (ref 0–5)

## 2014-11-13 LAB — CK TOTAL AND CKMB (NOT AT ARMC)
CK, TOTAL: 77 U/L
CK, Total: 79 U/L
CK, Total: 76 U/L
CK-MB: 1.4 ng/mL (ref 0.5–3.6)
CK-MB: 1 ng/mL (ref 0.5–3.6)
CK-MB: 1.1 ng/mL (ref 0.5–3.6)

## 2014-11-13 LAB — COMPREHENSIVE METABOLIC PANEL
ALBUMIN: 3.1 g/dL — AB (ref 3.4–5.0)
ALK PHOS: 67 U/L
ALT: 19 U/L
AST: 25 U/L (ref 15–37)
Anion Gap: 3 — ABNORMAL LOW (ref 7–16)
BILIRUBIN TOTAL: 0.6 mg/dL (ref 0.2–1.0)
BUN: 20 mg/dL — AB (ref 7–18)
CALCIUM: 8.4 mg/dL — AB (ref 8.5–10.1)
CREATININE: 1.17 mg/dL (ref 0.60–1.30)
Chloride: 105 mmol/L (ref 98–107)
Co2: 33 mmol/L — ABNORMAL HIGH (ref 21–32)
EGFR (Non-African Amer.): 46 — ABNORMAL LOW
GFR CALC AF AMER: 56 — AB
Glucose: 152 mg/dL — ABNORMAL HIGH (ref 65–99)
Osmolality: 287 (ref 275–301)
Potassium: 4.2 mmol/L (ref 3.5–5.1)
SODIUM: 141 mmol/L (ref 136–145)
TOTAL PROTEIN: 6 g/dL — AB (ref 6.4–8.2)

## 2014-11-13 LAB — PROTIME-INR
INR: 1.1
Prothrombin Time: 14.4 secs (ref 11.5–14.7)

## 2014-11-13 LAB — CBC
HCT: 36.7 % (ref 35.0–47.0)
HGB: 12.4 g/dL (ref 12.0–16.0)
MCH: 32.9 pg (ref 26.0–34.0)
MCHC: 33.8 g/dL (ref 32.0–36.0)
MCV: 98 fL (ref 80–100)
Platelet: 195 10*3/uL (ref 150–440)
RBC: 3.76 10*6/uL — AB (ref 3.80–5.20)
RDW: 13.3 % (ref 11.5–14.5)
WBC: 10.5 10*3/uL (ref 3.6–11.0)

## 2014-11-13 LAB — TROPONIN I
TROPONIN-I: 0.03 ng/mL
Troponin-I: 0.04 ng/mL
Troponin-I: 0.03 ng/mL

## 2014-11-14 DIAGNOSIS — R42 Dizziness and giddiness: Secondary | ICD-10-CM

## 2014-11-14 DIAGNOSIS — I10 Essential (primary) hypertension: Secondary | ICD-10-CM

## 2014-11-14 DIAGNOSIS — R001 Bradycardia, unspecified: Secondary | ICD-10-CM

## 2014-11-14 LAB — CBC WITH DIFFERENTIAL/PLATELET
BASOS PCT: 0.2 %
Basophil #: 0 10*3/uL (ref 0.0–0.1)
EOS PCT: 3.2 %
Eosinophil #: 0.2 10*3/uL (ref 0.0–0.7)
HCT: 30.6 % — AB (ref 35.0–47.0)
HGB: 10.3 g/dL — ABNORMAL LOW (ref 12.0–16.0)
LYMPHS PCT: 19.2 %
Lymphocyte #: 1.5 10*3/uL (ref 1.0–3.6)
MCH: 32.8 pg (ref 26.0–34.0)
MCHC: 33.6 g/dL (ref 32.0–36.0)
MCV: 98 fL (ref 80–100)
Monocyte #: 1.1 x10 3/mm — ABNORMAL HIGH (ref 0.2–0.9)
Monocyte %: 14.8 %
Neutrophil #: 4.8 10*3/uL (ref 1.4–6.5)
Neutrophil %: 62.6 %
Platelet: 167 10*3/uL (ref 150–440)
RBC: 3.14 10*6/uL — AB (ref 3.80–5.20)
RDW: 13 % (ref 11.5–14.5)
WBC: 7.7 10*3/uL (ref 3.6–11.0)

## 2014-11-14 LAB — BASIC METABOLIC PANEL
Anion Gap: 7 (ref 7–16)
BUN: 30 mg/dL — AB (ref 7–18)
CALCIUM: 8.2 mg/dL — AB (ref 8.5–10.1)
Chloride: 107 mmol/L (ref 98–107)
Co2: 29 mmol/L (ref 21–32)
Creatinine: 1.36 mg/dL — ABNORMAL HIGH (ref 0.60–1.30)
GFR CALC AF AMER: 47 — AB
GFR CALC NON AF AMER: 39 — AB
Glucose: 102 mg/dL — ABNORMAL HIGH (ref 65–99)
Osmolality: 291 (ref 275–301)
Potassium: 3.7 mmol/L (ref 3.5–5.1)
Sodium: 143 mmol/L (ref 136–145)

## 2014-11-15 LAB — HEMOGLOBIN: HGB: 10.4 g/dL — ABNORMAL LOW (ref 12.0–16.0)

## 2014-12-05 ENCOUNTER — Encounter: Payer: Self-pay | Admitting: Cardiovascular Disease

## 2014-12-05 ENCOUNTER — Ambulatory Visit (INDEPENDENT_AMBULATORY_CARE_PROVIDER_SITE_OTHER): Payer: Medicare Other | Admitting: Cardiovascular Disease

## 2014-12-05 VITALS — BP 140/80 | HR 66 | Ht 66.5 in | Wt 168.5 lb

## 2014-12-05 DIAGNOSIS — S40022D Contusion of left upper arm, subsequent encounter: Secondary | ICD-10-CM

## 2014-12-05 DIAGNOSIS — R296 Repeated falls: Secondary | ICD-10-CM

## 2014-12-05 DIAGNOSIS — R0602 Shortness of breath: Secondary | ICD-10-CM

## 2014-12-05 DIAGNOSIS — I480 Paroxysmal atrial fibrillation: Secondary | ICD-10-CM

## 2014-12-05 DIAGNOSIS — I10 Essential (primary) hypertension: Secondary | ICD-10-CM

## 2014-12-05 DIAGNOSIS — S40029A Contusion of unspecified upper arm, initial encounter: Secondary | ICD-10-CM | POA: Insufficient documentation

## 2014-12-05 NOTE — Patient Instructions (Signed)
You are doing well. No medication changes were made.  Please call us if you have new issues that need to be addressed before your next appt.  Your physician wants you to follow-up in: 6 months.  You will receive a reminder letter in the mail two months in advance. If you don't receive a letter, please call our office to schedule the follow-up appointment.   

## 2014-12-05 NOTE — Assessment & Plan Note (Signed)
Blood pressure well controlled. No changes made. Losartan and chlorthalidone held previously

## 2014-12-05 NOTE — Assessment & Plan Note (Signed)
Maintaining normal sinus rhythm on today's visit. No changes to her medications. 

## 2014-12-05 NOTE — Assessment & Plan Note (Signed)
Currently being managed by her nursing home. High fall risk even at the nursing home. She uses a walker

## 2014-12-05 NOTE — Progress Notes (Signed)
Patient ID: Lorenda IshiharaReba Flippo, female    DOB: [birth year], 80 y.o.   MRN: [id number]  HPI Comments: [name] is a very pleasant 80 year old woman who [facility] with a history of hypertension, long history of smoking from age 80 to age 80, dementia,  on oxygen, who presents for routine followup of her blood pressure and medication adjustment.  Son presents with her today. Records indicate she had a fall 11/13/2014. Son reports that she lost her balance while at the nursing home. She's had several falls After this fall, she was sent to the emergency room. Records were reviewed from 11/14/2014. She had echocardiogram showing ejection fraction 55%, moderate LVH EKG showed sinus bradycardia with rate 50 bpm, normal sinus rhythm CT scan of the head showing no acute fracture, plaque noted in the right carotid artery CT scan of the spine showed no fracture atherosclerotic plaque in the left greater than the right carotid Lab work reviewed showing normal cardiac enzymes, creatinine 1.17 on 11/13/2014  She suffered trauma to her left arm, left flank. She developed a very large hematoma under her left arm. She continues to have a large hematoma in her axillary area. She has some range of motion above her shoulder height with the left arm EKG on today's visit shows normal sinus rhythm with rate 66 bpm, left axis deviation   Chlorthalidone and losartan held in the past for low blood pressure  Other past medical history  Recent fall 10/05/2014, found to be bradycardic with heart rates in the low 50s  Given IV fluids in the emergency room, converted to atrial fibrillation . Started on low-dose metoprolol and converted back to normal sinus rhythm .  She's been home now for 3 weeks and denies having any problems. No recent falls, no lightheadedness or dizziness, no tachycardia or palpitations Notes provided from the nursing facility report systolic pressure 130 up to 160  Prior  echocardiogram showing normal LV systolic function ejection fraction 60%, moderate TR, biventricular systolic pressure 40-45 mm mercury  Outpatient Encounter Prescriptions as of 12/05/2014  Medication Sig  . alendronate (FOSAMAX) 70 MG tablet Take 70 mg by mouth every 7 (seven) days. Take with a full glass of water on an empty stomach.  Marland Kitchen. amLODipine (NORVASC) 5 MG tablet Take 5 mg by mouth daily.   Marland Kitchen. aspirin 81 MG tablet Take 81 mg by mouth daily.  Marland Kitchen. atorvastatin (LIPITOR) 40 MG tablet Take 40 mg by mouth daily.  . brimonidine (ALPHAGAN) 0.2 % ophthalmic solution Place 1 drop into both eyes 3 (three) times daily.  . Calcium Carbonate-Vitamin D 600-400 MG-UNIT per tablet Take 1 tablet by mouth daily.  . citalopram (CELEXA) 20 MG tablet Take 20 mg by mouth daily.  . ferrous sulfate 325 (65 FE) MG tablet Take 325 mg by mouth 3 (three) times daily with meals.  . Fluticasone-Salmeterol (ADVAIR DISKUS) 250-50 MCG/DOSE AEPB Inhale 1 puff into the lungs every 12 (twelve) hours.  Marland Kitchen. morphine (MSIR) 15 MG tablet Take 15 mg by mouth as needed for pain.  Marland Kitchen. omeprazole (PRILOSEC) 20 MG capsule Take 20 mg by mouth daily.  . OXYGEN Inhale into the lungs at bedtime.  . rivastigmine (EXELON) 4.6 mg/24hr Place 1 patch onto the skin daily.  Marland Kitchen. tiotropium (SPIRIVA HANDIHALER) 18 MCG inhalation capsule Place 18 mcg into inhaler and inhale daily.  . [DISCONTINUED] chlorthalidone (HYGROTON) 25 MG tablet Take 12.5 mg by mouth every other day.   . [DISCONTINUED] metoprolol tartrate (LOPRESSOR) 25  MG tablet Take 12.5 mg by mouth 2 (two) times daily.   Social history  reports that she has quit smoking. Her smoking use included Cigarettes. She has a 45 pack-year smoking history. She does not have any smokeless tobacco history on file. She reports that she does not drink alcohol or use illicit drugs.  Review of Systems  Constitutional: Negative.        Swelling in her left axillary area  Respiratory: Negative.    Cardiovascular: Negative.   Gastrointestinal: Negative.   Musculoskeletal: Positive for arthralgias.       Mildly decreased left arm range of motion, tenderness when she raises her left arm and the shoulder area  Skin: Negative.   Neurological: Negative.   Hematological: Negative.   Psychiatric/Behavioral: Negative.   All other systems reviewed and are negative.   BP 140/80 mmHg  Pulse 66  Ht 5' 6.5" (1.689 m)  Wt 168 lb 8 oz (76.431 kg)  BMI 26.79 kg/m2  Physical Exam  Constitutional: She is oriented to person, place, and time. She appears well-developed and well-nourished.  On examination of her left axillary area, she has a large hematoma the size of a small orange. Tenderness with palpation. Some mild limitation in her range of motion of her left shoulder secondary to discomfort  HENT:  Head: Normocephalic.  Nose: Nose normal.  Mouth/Throat: Oropharynx is clear and moist.  Eyes: Conjunctivae are normal. Pupils are equal, round, and reactive to light.  Neck: Normal range of motion. Neck supple. No JVD present.  Cardiovascular: Normal rate, regular rhythm, S1 normal, S2 normal, normal heart sounds and intact distal pulses.  Exam reveals no gallop and no friction rub.   No murmur heard. Pulmonary/Chest: Effort normal and breath sounds normal. No respiratory distress. She has no wheezes. She has no rales. She exhibits no tenderness.  Abdominal: Soft. Bowel sounds are normal. She exhibits no distension. There is no tenderness.  Musculoskeletal: Normal range of motion. She exhibits no edema or tenderness.  Lymphadenopathy:    She has no cervical adenopathy.  Neurological: She is alert and oriented to person, place, and time. Coordination normal.  Skin: Skin is warm and dry. No rash noted. No erythema.  Psychiatric: She has a normal mood and affect. Her behavior is normal. Judgment and thought content normal.    Assessment and Plan  Nursing note and vitals reviewed.

## 2014-12-05 NOTE — Assessment & Plan Note (Addendum)
Recent fall with trauma to her left flank and arm, shoulder. Hospital records reviewed with her. Mildly tender hematoma. No change in management needed. This will likely slowly reabsorb. She is a high fall risk

## 2015-01-25 ENCOUNTER — Ambulatory Visit: Payer: PRIVATE HEALTH INSURANCE | Admitting: Cardiovascular Disease

## 2015-01-25 ENCOUNTER — Encounter: Payer: Self-pay | Admitting: *Deleted

## 2015-04-11 ENCOUNTER — Emergency Department
Admit: 2015-04-11 | Disposition: A | Discharge status: Home / Self Care | Payer: Self-pay | Admitting: Emergency Medicine

## 2015-04-22 NOTE — H&P (Signed)
PATIENT NAME:  Evelyn Taylor, Evelyn Taylor MR#:  [id number] DATE OF BIRTH:  08-13-1921  REFERRED EMERGENCY ROOM PHYSICIAN: Coolidge Breeze, MD   PRIMARY CARE PHYSICIAN: None.   CHIEF COMPLAINT: Fall, with dizziness.   HISTORY OF PRESENT ILLNESS: This very pleasant 81 year old female presents from [facility] after a fall while going to lunch. She had an unwitnessed fall from a standing position. She hit her head on the wall and reported feeling dizzy prior to fall. EMS was called and reported that initial systolic blood pressure was in the 70s. Repeat blood pressure was 116/62, heart rate normal sinus at 50. She was brought to the Emergency Room for further evaluation. Soon after presentation to the Emergency Room, she was found to be in atrial fibrillation with rapid ventricular response with a rate of about 125.  At that time, Dr. Gwen Pounds, cardiology, was contacted and instructions were given to begin low dose oral rate control agent. She was given 12.5 mg of metoprolol p.o. which brought her heart rate down into the 90s-100 range. Hospitalist services are asked to admit for further evaluation and treatment of new onset atrial fibrillation with RVR.   PAST MEDICAL HISTORY: 1.  Hyperlipidemia.  2.  COPD.   3.  Dementia.  4.  Osteoporosis.  5.  Gastroesophageal reflux disease.  PAST SURGICAL HISTORY: 1.  Right hip replacement.  2.  Cholecystectomy.  3.  Hysterectomy.  4.  Lumpectomy.   SOCIAL HISTORY: The patient [facility]. She uses a walker for ambulation. She has oxygen available as needed and uses it while sleeping or at rest. She is a former smoker; quit about 1 year ago. She does not drink alcohol or use illicit substances. Her daughter, Juanna Cao, is her power of attorney. The patient is a DO NOT RESUSCITATE.  FAMILY HISTORY: Her father had a heart attack in his 10s. Her mother lived to age 55 and died of natural causes. There is no family  history of stroke. Her sister had ovarian cancer.   ALLERGIES: No known allergies.   HOME MEDICATIONS: 1.  Spiriva 18 mcg inhalation 1 capsule inhaled daily.  2.  Omeprazole 20 mg 1 capsule every day before breakfast.  3.  Morphine extended release 15 mg/12 hour oral tablet, 1 tablet every 12 hours as needed for pain.  4.  Ferrous sulfate 325 mg 1 tablet 3 times a day.  5.  Exelon 4.6 mg/24 hour transdermal film, one patch once a day.  6.  Citalopram 20 mg 1 tablet once a day.  7.  Calcarb with D 600 mg/400 International Units 1 tablet twice a day.  8.  Brimonidine 0.2% ophthalmic solution 1 drop into each eye 3 times a day.  9.  Atorvastatin 40 mg 1 tablet daily.  10.  Alendronate 70 mg 1 tablet once a week on Saturdays.  11.  Advair Diskus 250 mcg/50 mcg inhalation powder, 1 puff inhaled 2 times a day.   REVIEW OF SYSTEMS: Negative for fevers, chills, weight change. Negative for change in vision or hearing, pain in the eyes or ears, difficulty swallowing or pain in the mouth. Negative for shortness of breath, cough, wheezing, or recent  COPD exacerbation. Negative for chest pain, orthopnea, edema, sensation of palpitations. Negative for nausea, vomiting, diarrhea, or abdominal pain. Negative for dysuria or frequency. Negative for recent tender or swollen joints or myalgias. Negative for focal numbness, weakness, dysarthria, or change in memory. Negative for uncontrolled anxiety or depression.  PHYSICAL EXAMINATION: VITAL SIGNS: Temperature 97.5, pulse 125, respirations 17, blood pressure 133/78, oxygenation 96% on 2 liters nasal cannula.  GENERAL: No acute distress.  HEENT: Pupils are equal, round, and reactive to light. Conjunctivae are clear, extraocular motion is intact. Oral mucous membranes are pink and moist. Good dentition. Posterior oropharynx is clear with no exudate or edema. Trachea is midline.  NECK:  No cervical lymphadenopathy.  PULMONARY: Lungs are clear to auscultation  bilaterally with good air movement. She is exhibiting some purse lipped breathing.  CARDIOVASCULAR: Irregularly irregular, tachycardic. No murmurs, rubs, or gallops. Heart sounds are distant. Peripheral pulses are 1+. There is no edema.  ABDOMEN: Soft, nontender, nondistended. Bowel sounds are normal. No guarding, no rebound, no hepatosplenomegaly.  EXTREMITIES: The patient is able to move all 4 extremities. Strength is 4 out of 4 throughout. No tender or swollen joints.  SKIN: She does have ecchymosis over the left forearm which appears old. No skin breaks. No induration or tenderness over this area.  NEUROLOGIC: Cranial nerves II through XII are grossly intact. Sensation and strength are intact, nonfocal neurologic examination.  PSYCHIATRIC: The patient is alert. She is oriented to person, not to place or date. She does not seem anxious or depressed.   LABORATORY DATA: Sodium 140, potassium 4.7, chloride 106, bicarbonate 29, BUN 22, creatinine 1.29, glucose 118. Troponin 0.03. White blood cells 7.6, hemoglobin 12.9, platelets 170,000, MCV is 98.  Urine shows trace leukocyte esterase with 8 white blood cells per high-powered field.   IMAGING: CT cervical spine without contrast shows no acute intracranial hemorrhage or evidence of acute ischemic change. Stable changes of chronic small vessel ischemia as well as an area of encephalomalacia-old lacunar infarction anteriorly in the right basal ganglia. There is no acute skull fracture. No acute cervical spine fracture or dislocation. There is mild mid cervical degenerative disk changes as well as bilateral facet joint hypertrophy.   ASSESSMENT AND PLAN: 1.  Atrial fibrillation with rapid ventricular response: She has responded well to metoprolol 12.5 mg initially with conversion to normal sinus rhythm. She was bradycardic with heart rate in the 40s. Cardiology, Dr. Gwen PoundsKowalski, has been consulted and following the patient. We will check a TSH, 2-D  echocardiogram. We will admit on telemetry and continue fluids. We will cycle cardiac enzymes. This is her first presentation with atrial fibrillation. Her daughter reports that over the past few months she has had some "dizzy spells" so she may actually have been in paroxysmal atrial fibrillation.  2.  Chronic obstructive pulmonary disease: No sign of exacerbation at this point. We will continue with oxygen as needed to keep oxygen saturation greater than 90%. We will continue her home regimen of Spiriva, Advair, and albuterol.  3.  Dementia: Per her daughter, she seems to be at baseline currently. Will continue with Exelon.  4.  Hyperlipidemia: Continue with statin.  6.  Osteoporosis: Continue with bisphosphonate and calcium. There are no signs of fracture on her CT of the cervical spine. No signs of extremity fracture after fall.  7.  Possible urinary tract infection with trace leukocyte esterase and 8 white blood cells per high-powered field. Will send urine for culture and await culture results before treating.  8.  Prophylaxis: Heparin and Protonix.   TIME SPENT ON ADMISSION: 45 minutes.     ____________________________ Ena Dawleyatherine P. Clent RidgesWalsh, MD cpw:LT D: 10/05/2014 17:13:01 ET T: 10/05/2014 18:44:29 ET JOB#: [id number]  cc: Santina Evansatherine P. Clent RidgesWalsh, MD, <Dictator> Gale JourneyATHERINE P Samiel Peel MD ELECTRONICALLY SIGNED 10/10/2014  14:00 

## 2015-04-22 NOTE — Consult Note (Signed)
PATIENT NAME:  Evelyn Taylor, Evelyn Taylor MR#:  [id number] DATE OF BIRTH:  03/08/1921  DATE OF CONSULTATION:  10/05/2014  CONSULTING PHYSICIAN:  Lamar BlinksBruce J. Lannette Avellino, MD  REFERRING PHYSICIAN:  Elby Showersatherine Walsh, MD  REASON FOR CONSULTATION: Paroxysmal atrial fibrillation with rapid ventricular rate after a syncopal episode.   HISTORY OF PRESENT ILLNESS: This is a 81 year old female with known borderline hypertension and hyperlipidemia on appropriate medication management although had some dizziness with concerns of hypertension medications causing dizziness and therefore was taken off. This has improved her dizziness, although she had a fall episode specifically related to her previous hypotension. This fall episode was treated in the Emergency Room and the patient was found to have atrial fibrillation, paroxysmal nonvalvular in nature, with rapid ventricular rate of 120 beats per minute and she was somewhat short of breath. At that time, the patient was given assessment and it was converted to normal sinus rhythm at that time and she had normal sinus rhythm at 40 beats per minute. Again she had an episode of atrial fibrillation in the Emergency Room and was back to 120 beats per minute.  REVIEW OF SYSTEMS: Remainder of review of systems not assessed due to the patient's inability to converse well.   PAST MEDICAL HISTORY:  1. Essential hypertension.  2. Mixed hyperlipidemia.   FAMILY HISTORY: No family members with early onset of cardiovascular disease or hypertension.   SOCIAL HISTORY: The patient has no tobacco or alcohol use.   ALLERGIES: AS LISTED.   MEDICATIONS: As listed.   PHYSICAL EXAMINATION:  VITAL SIGNS: Blood pressure is 100/60 bilaterally. Heart rate is 98 and irregular.  GENERAL: She is a well-appearing elderly female in no acute distress.  HEENT: No icterus, thyromegaly, ulcers, hemorrhage, or xanthelasma.  CARDIOVASCULAR: Irregularly irregular with normal S1 and S2; 2/6 apical murmur  consistent with mitral regurgitation. PMI is diffuse. Carotid upstroke normal without bruit. Jugular venous pressure is normal.  LUNGS: Have a few diffuse expiratory wheezes.  ABDOMEN: Soft, nontender, without hepatosplenomegaly or masses. Abdominal aorta is normal size without bruit.  EXTREMITIES: She has 2+ radial, femoral, dorsal pedal pulses, with no lower extremity edema, cyanosis, clubbing or ulcers.  NEUROLOGIC: The patient is not oriented to time, place .    ASSESSMENT: A 81 year old female with a nonvalvular paroxysmal atrial fibrillation, mixed hyperlipidemia and essential hypertension with a fall episode and/or possible syncope, needing further treatment options.   RECOMMENDATIONS:  1. Metoprolol tartrate 12.5 mg for possible heart rate control and spontaneous conversion to normal sinus rhythm and no use of higher dose due to concerns of sick sinus syndrome and/or bradycardia. Would not use amiodarone at this time due to concerns of other side effects. 2. Heparin at this time if not contraindicated and will talk with the family as to whether fall risk and other concerns of bleeding would be contraindication to use of anticoagulation.  3. Echocardiogram for LV systolic dysfunction and cause of atrial fibrillation.  4. No further intervention with no evidence of myocardial infarction with normal troponin.  5. Ambulate and adjust medications thereafter.     ____________________________ Lamar BlinksBruce J. Wladyslaw Henrichs, MD bjk:JT D: 10/05/2014 18:20:48 ET T: 10/05/2014 18:52:27 ET JOB#: [id number]  cc: Lamar BlinksBruce J. Kosisochukwu Burningham, MD, <Dictator> Lamar BlinksBRUCE J Angle Karel MD ELECTRONICALLY SIGNED 10/06/2014 6:53

## 2015-04-22 NOTE — Consult Note (Signed)
General Aspect Primary Cardiologist: Dr. Rockey Situ, MD Seen in [facility] heartcare clinic   __________________  81 year old female with history of isolated episode of a-fib (10/05/2014 secondary to fluids), HTN, DM2, COPD, dehydration, dementia, GERD, osteporosis, and long standing tobacco abuse (age 57 to 9) who was recently admitted to [facility] in earlier October after suffering a fall/dizziness and was found to have a brief episode of a-fib, presented to [facility] after feeling dizzy and suffering a mechanical fall yesterday morning. Cardiology was consulted for near syncope, h/o atrial fib. __________________  PMH: 1. Isolated episode of a-fib (10/05/2014 secondary to fluids) 2. HTN 3. DM2 4. COPD 5. Dehydration 6. Dementia 7. GERD 8. Osteoporosis 9. Lonstanding tobacco abuse __________________   Present Illness She comes into [facility] this time after feeling dizzy on the morning of 11/13/2014 and suffering a fall. She does not recall what she was doing prior to the fall, just that she felt dizzy and fell. She did hit her left arm and suffered a bruise. She states she did hit her head, but did not suffer LOC. She denies any palpitations, chest pain, or increased dyspnea. No increased LEE. She is able to sleep supine without issues. She uses O2 at home prn. Weight has been stable.   Since her arrival at [facility] she has had troponin negative x 3. EKG with sinus bradycardia, 50, left axis, rare PVC, new TWI inferior leads. SCr 1.36 (prior 1.17), BUN 30 (prior 20), hgb 10.3. CT head without acute intracranail abnormality.  Patient with longstanding issues with falling. States she falls quite frequently. When asked how frequently she she cannot tell me how often. She was recently admitted to [facility] in the beginning of October for dizziness and fall. Some of her antihypertensives were changed and she did feel better, though she did still have some falling episodes. She was hydrated via IV while in the hospital and  developed an isolated episode of a-fib 2/2 fluids. She was started on metoprolol. She was seen in [facility] clinic, found to be orthostatic and her chlorthalidone was stopped. In a follow up office visit, her losartan was held.   She was a poor anticoagulation candidate 2/2 her high fall risk.   Physical Exam:  GEN well developed, well nourished, no acute distress   HEENT hearing intact to voice, moist oral mucosa   NECK supple   RESP normal resp effort  clear BS   CARD Bradycardic  Murmur   Murmur Systolic  2/6   ABD denies tenderness  no hernia  soft   EXTR negative edema   NEURO cranial nerves intact   PSYCH alert, A+O to time, place, person   Review of Systems:  Subjective/Chief Complaint weakness, gait instability, dizziness   General: Fatigue  Weakness   Skin: No Complaints   ENT: Decreased hearing   Eyes: No Complaints   Neck: No Complaints   Respiratory: No Complaints   Cardiovascular: No Complaints   Gastrointestinal: No Complaints   Genitourinary: No Complaints   Vascular: No Complaints   Musculoskeletal: No Complaints   Neurologic: Dizzness   Hematologic: Ease of bruising   Endocrine: No Complaints   Psychiatric: No Complaints   Review of Systems: All other systems were reviewed and found to be negative   Medications/Allergies Reviewed Medications/Allergies reviewed   Family & Social History:  Family and Social History:  Family History Hypertension  Diabetes Mellitus   Social History negative ETOH, negative Illicit drugs   + Tobacco Prior (greater  than 1 year)  smoked from age [age]   Place of Living Home     Diabetes:    HTN:    Emphysema:    COPD:          Admit Diagnosis:   SYNCOPE AND COLLAPSE: Onset Date: 14-Nov-2014, Status: Active, Description: SYNCOPE AND COLLAPSE  Home Medications: Medication Instructions Status  aspirin 81 mg oral delayed release tablet 1 tab(s) orally once a day Active  Calcarb with  D 600 mg-400 intl units oral tablet 1 tab(s) orally 2 times a day Active  omeprazole 20 mg oral delayed release capsule 1 cap(s) orally once a day before breakfast Active  ferrous sulfate 325 mg (65 mg elemental iron) oral tablet 1 tab(s) orally 3 times a day Active  Spiriva 18 mcg inhalation capsule 1 cap(s) via handihaler once a day Active  Metoprolol Tartrate 25 mg oral tablet 0.5 tab (12.65m) orally 2 times a day Active  morphine extended release 15 mg/12 hr oral tablet, extended release 1 tab(s) orally once a day (at bedtime), As Needed - for Pain Active  Robitussin 100 mg/5 mL oral liquid 2 teaspoonsful (10 milliliters) orally every 6 hours as needed for cough. Active  alendronate 70 mg oral tablet 1 tab(s) orally once a week on Saturday Active  citalopram 20 mg oral tablet 1 tab(s) orally once a day Active  Exelon 4.6 mg/24 hr transdermal film, extended release 1 patch transdermal once a day Active  Advair Diskus 250 mcg-50 mcg inhalation powder 1 puff(s) inhaled 2 times a day Active  brimonidine 0.2% ophthalmic solution 1 drop(s) to each eye 3 times a day *wait 3-5 minutes between drops* Active  atorvastatin 40 mg oral tablet 1 tab(s) orally once a day (at bedtime) Active   Lab Results:  Routine Chem:  15-Nov-15 13:17   BUN  20  Creatinine (comp) 1.17  16-Nov-15 04:48   Glucose, Serum  102  BUN  30  Creatinine (comp)  1.36  Sodium, Serum 143  Potassium, Serum 3.7  Chloride, Serum 107  CO2, Serum 29  Calcium (Total), Serum  8.2  Anion Gap 7  Osmolality (calc) 291  eGFR (African American)  47  eGFR (Non-African American)  39 (eGFR values <640mmin/1.73 m2 may be an indication of chronic kidney disease (CKD). Calculated eGFR, using the MRDR Study equation, is useful in  patients with stable renal function. The eGFR calculation will not be reliable in acutely ill patients when serum creatinine is changing rapidly. It is not useful in patients on dialysis. The eGFR calculation  may not be applicable to patients at the low and high extremes of body sizes, pregnant women, and vegetarians.)  Cardiac:  15-Nov-15 13:17   Troponin I 0.03 (0.00-0.05 0.05 ng/mL or less: NEGATIVE  Repeat testing in 3-6 hrs  if clinically indicated. >0.05 ng/mL: POTENTIAL  MYOCARDIAL INJURY. Repeat  testing in 3-6 hrs if  clinically indicated. NOTE: An increase or decrease  of 30% or more on serial  testing suggests a  clinically important change)    17:19   Troponin I 0.04 (0.00-0.05 0.05 ng/mL or less: NEGATIVE  Repeat testing in 3-6 hrs  if clinically indicated. >0.05 ng/mL: POTENTIAL  MYOCARDIAL INJURY. Repeat  testing in 3-6 hrs if  clinically indicated. NOTE: An increase or decrease  of 30% or more on serial  testing suggests a  clinically important change)    21:16   Troponin I 0.03 (0.00-0.05 0.05 ng/mL or less: NEGATIVE  Repeat testing in  3-6 hrs  if clinically indicated. >0.05 ng/mL: POTENTIAL  MYOCARDIAL INJURY. Repeat  testing in 3-6 hrs if  clinically indicated. NOTE: An increase or decrease  of 30% or more on serial  testing suggests a  clinically important change)  Routine Hem:  16-Nov-15 04:48   WBC (CBC) 7.7  RBC (CBC)  3.14  Hemoglobin (CBC)  10.3  Hematocrit (CBC)  30.6  Platelet Count (CBC) 167  MCV 98  MCH 32.8  MCHC 33.6  RDW 13.0  Neutrophil % 62.6  Lymphocyte % 19.2  Monocyte % 14.8  Eosinophil % 3.2  Basophil % 0.2  Neutrophil # 4.8  Lymphocyte # 1.5  Monocyte #  1.1  Eosinophil # 0.2  Basophil # 0.0 (Result(s) reported on 14 Nov 2014 at 05:30AM.)   EKG:  EKG Interp. by me   Interpretation EKG with sinus bradycardia, rate 50, left axis, rare PVC, inferior TWI   Radiology Results: XRay:    15-Nov-15 13:27, Chest Portable Single View  Chest Portable Single View   REASON FOR EXAM:    fall, left chest pain  COMMENTS:       PROCEDURE: DXR - DXR PORTABLE CHEST SINGLE VIEW  - Nov 13 2014  1:27PM     CLINICAL DATA:   81 year old female with left-sided chest pain after  falling.    EXAM:  PORTABLE CHEST - 1 VIEW    COMPARISON:  Prior chest x-ray 11/15/2013    FINDINGS:  Stable borderline cardiomegaly. Atherosclerotic calcifications are  present within the transverse aorta. No pneumothorax or pleural  effusion. No focal airspace consolidation. Stable chronic central  bronchitic changes and interstitial prominence. A No acute fracture  identified.     IMPRESSION:  Stable chest x-ray without evidence of acute cardiopulmonary  process.      Electronically Signed    By: Jacqulynn Cadet M.D.    On: 11/13/2014 14:39       Verified By: Criselda Peaches, M.D.,  CT:    15-Nov-15 14:28, CT Head Without Contrast  CT Head Without Contrast   REASON FOR EXAM:    fall  COMMENTS:       PROCEDURE: CT  - CT HEAD WITHOUT CONTRAST  - Nov 13 2014  2:28PM     CLINICAL DATA:  81 year old female status post fall while getting up  and going to the bathroom. Recurrent syncope up on EMS arrival.    EXAM:  CT HEAD WITHOUT CONTRAST    CT CERVICAL SPINE WITHOUT CONTRAST    TECHNIQUE:  Multidetector CT imaging of the head and cervical spine was  performed following the standard protocol without intravenous  contrast. Multiplanar CT image reconstructions of the cervical spine  were also generated.    COMPARISON:  Recent prior CT scan of the head and cervical spine  10/05/2014    FINDINGS:  CT HEAD FINDINGS    Negative for acute intracranial hemorrhage, acute infarction, mass,  mass effect, hydrocephalus or midline shift. Gray-white  differentiation is preserved throughout. Stable pattern of severe  chronic microvascular ischemic white matter disease. Remote infarct  of the right caudate head is also unchanged. Stable atrophy and ex  vacuo ventriculomegaly with asymmetry of the frontal horns of the  lateral ventricles (larger on the right than the left).    CT CERVICAL SPINE FINDINGS    No acute  fracture, malalignment or prevertebral soft tissue  swelling. Multilevel cervical spondylosis no significant at C4-C5  and C5-C6. Stable 3 mm of anterolisthesis on  C4 on C5 without  significant interval change. Anterior translation is favored to be  degenerative. Advanced multilevel bilateral facet arthropathy.  Degenerative changes are also present about the atlantodental  interval. Unremarkable CT appearance of the thyroid gland. No acute  soft tissue abnormality. The lung apices are unremarkable.  Atherosclerotic calcifications in the left greater than right  carotid arteries.   IMPRESSION:  CT HEAD    1. No acute intracranial abnormality.  2. Stable atrophy, severe chronic microvascular ischemic white  matter disease and asymmetric ventriculomegaly.  CT CSPINE    1. No acute fracture or malalignment.  2. Stable appearance of multilevel cervical spondylosis,  degenerative anterolisthesis of C4 on C5 and bilateral facet  arthropathy.  3. Calcified atherosclerotic plaque in the left greater than right  carotid arteries.  Electronically Signed    By: Jacqulynn Cadet M.D.    On: 11/13/2014 15:41         Verified By: Criselda Peaches, M.D.,    No Known Allergies:   Vital Signs/Nurse's Notes:  **Vital Signs.:   16-Nov-15 07:35  Vital Signs Type Routine  Pulse Pulse 67  Respirations Respirations 19  Systolic BP Systolic BP 390  Mean BP 300  Pulse Ox % Pulse Ox % 95  Pulse Ox Activity Level  At rest  Oxygen Delivery Room Air/ 21 %    Impression 81 year old female with history of isolated episode of a-fib (10/05/2014 secondary to fluids), HTN, DM2, COPD, dehydration, dementia, GERD, osteporosis, and long standing tobacco abuse (age 73 to 65) who was recently admitted to [facility] in earlier October after suffering a fall/dizziness and was found to have a brief episode of a-fib, presented to [facility] after feeling dizzy and suffering a mechanical fall yesterday morning. Her  metoprolol was held, troponin negative x 3, EKG with sinus bradyacardia, 50, rare PVC, left axis, new inferior TWI. Cardiology was consulted for further evaluation.   1. Dizziness/presyncope: etiology unclear, possibly mechanical though unable to exclude arrhythmia or orthostasis. Bradycardia on tele overnight. BP overnight did reach 923 systolic, supine suggesting continued orthostasis echo pending to evaluate LV function (prior echo with EF 60%, moderate TR, biventricular systolic pressure 30-07 mm mercury)  Troponin negative x 3, EKG - sinus bradycardia, 50, new TWI inferior leads (per prior note family prefers less aggressive work up, to d/w family) -Would hold b-blocker. Now on no BP meds -Encourage po fluids, consider  -Could pursue outpatient cardiac monitoring if family wishes, 30 day monitor If she contines to have documented orthostasis as an outpt, could add florinef daily or QOD   2. Bradycardia: -Metoprolol suspended per above  3. HTN: -Monitor as she is not on an antihypertensive at this time off all meds given frequent near syncope episodes  4. COPD: -Perform O2 challenge test while inpatient to see if she needs O2 all the time  5. History of tobacco abuse COPD  6. DM2: -SSI   Electronic Signatures: Rise Mu (PA-C)  (Signed [phone] 09:08)  Authored: General Aspect/Present Illness, History and Physical Exam, Review of System, Family & Social History, Past Medical History, Home Medications, Labs, EKG , Radiology, Allergies, Vital Signs/Nurse's Notes, Impression/Plan Ida Rogue (MD)  (Signed [phone] 09:28)  Authored: General Aspect/Present Illness, History and Physical Exam, Review of System, Health Issues, EKG , Vital Signs/Nurse's Notes, Impression/Plan  Co-Signer: General Aspect/Present Illness, History and Physical Exam, Review of System, Family & Social History, Past Medical History, Home Medications, Labs, EKG , Radiology, Allergies, Vital  Signs/Nurse's Notes, Impression/Plan   Last Updated: 16-Nov-15 09:28 by Ida Rogue (MD)

## 2015-04-22 NOTE — H&P (Signed)
PATIENT NAME:  Evelyn IshiharaBAKER, Elleni MR#:  [id number] DATE OF BIRTH:  1921/05/01  DATE OF ADMISSION:  11/13/2014  PRIMARY CARE PHYSICIAN: Antonieta Ibaimothy J. Gollan, MD is the primary that the patient has been seeing. She was seeing doctors making house calls, but is no longer seeing them.   CHIEF COMPLAINT: Presyncope and a fall.   HISTORY OF PRESENT ILLNESS: This is a very pleasant 81 year old female who was admitted in October 2015 with fall and dizziness as well, at that time found to have atrial fibrillation, new onset, who presents today after a fall. The patient woke up about 6:00 this morning, was feeling dizzy and took a mechanical fall. She bruised her left arm pretty significantly and now has a large hematoma. Her daughter came to check on her and the patient was still complaining of dizziness and the patient was kind of rocking back and forth, so the daughter felt the patient needed further evaluation In the ER. In the ER, she is noted to have a normal troponin, no acute EKG changes. Her heart rate is anywhere between 46-53. The patient's daughter denies any loss of consciousness.   REVIEW OF SYSTEMS:  CONSTITUTIONAL: No fever. Positive fatigue and weakness.  EYES: No blurred or double vision.  EARS, NOSE AND THROAT: Positive for hearing loss. No epistaxis or discharge.  RESPIRATORY: No cough, wheezing, hemoptysis, COPD. CARDIOVASCULAR: No chest pain, orthopnea, edema, arrhythmia, dyspnea on exertion, palpitations. Positive presyncope.  GASTROINTESTINAL: No nausea, vomiting, diarrhea, abdominal pain, melena or ulcers. GENITOURINARY: No dysuria or hematuria. ENDOCRINE: No thyroid problems. HEMATOLOGIC AND LYMPHATICS: Positive easy bruising and anemia.  SKIN: No rashes or lesions. She does have a large hematoma on the left arm.  MUSCULOSKELETAL: No limited activity.  NEUROLOGICAL: No history of CVAs, TIAs.  PSYCHIATRIC: She has some depression with mild cognitive impairment.   PAST MEDICAL HISTORY:   1.  Borderline diabetes. 2.  Essential hypertension.  3.  Emphysema with COPD, uses oxygen p.r.n.  4.  Depression with mild cognitive impairment.  5.  GERD.  6.  Osteoporosis.  MEDICATIONS:  1.  Oxygen p.r.n.  2.  Alendronate 70 mg weekly.  3.  Omeprazole 20 mg daily.  4.  Advair Diskus 250/50 b.i.d.  5.  Spiriva 1 capsule daily.  6.  Rivastigmine patch every day.  7.  Citalopram 20 mg daily.  8.  Aspirin 81 mg daily.  9.  Ferrous sulfate 325 mg daily.  10.  Brimonidine drops,  drop both eyes 3 times a day.  11.  Metoprolol 25 mg half a tablet b.i.d.  12.  Calcium with vitamin D 1 tablet p.o. b.i.d.  13.  Morphine sulfate ER 1 tablet by mouth at bedtime as needed p.r.n.  14.  Atorvastatin 40 mg daily.   ALLERGIES: No known drug allergies.   PAST SURGICAL HISTORY: Cholecystectomy.   SOCIAL HISTORY: The patient is at assisted living. She smokes anywhere between a couple of cigarettes to a half a pack a day. She is not going  to quit smoking. No alcohol or IV drug use.   PHYSICAL EXAMINATION: VITAL SIGNS: Pulse is 98, respirations 16, blood pressure 121/62, on room air 98%. The patient is afebrile.  GENERAL: The patient is alert, oriented, did not appear to be in acute distress.  HEENT: Head is atraumatic. Pupils are reactive. Sclerae are anicteric. Mucous membranes are dry. Oropharynx is clear. NECK: Supple without JVD, carotid bruit or enlarged thyroid.  CARDIOVASCULAR: Bradycardia without any murmurs, gallops or rubs. PMI is  not displaced.  LUNGS: Clear to auscultation without crackles, rales, rhonchi or wheezing. Normal to percussion.  ABDOMEN: Bowel sounds are positive. Nontender, nondistended. No hepatosplenomegaly.  EXTREMITIES: No clubbing, cyanosis or edema. NEUROLOGICAL: Cranial nerves II-XII are intact with no focal deficits. SKIN: The patient has a large hematoma on the left arm.   LABORATORIES: Troponin 0.03. White blood cells 10.5, hemoglobin 12.4, hematocrit 37,  platelets are 195,000. Sodium 141, potassium 4.2, chloride 105, bicarb 33, BUN 20, creatinine 1.17, glucose 152. ALT 19, AST 25, total protein 6.0 and albumin 3.1.  INR is 1.1.   Urinalysis is negative for LCE or nitrites.   Chest x-ray shows no acute cardiopulmonary disease.   EKG: Sinus bradycardia with a heart rate of 50 without any ST elevations or depressions.   ASSESSMENT AND PLAN: This is a 81 year old female who has had episodes of dizziness in the past, a recent diagnosis of atrial fibrillation, who presents with dizziness.  1.  Dizziness with presyncope. The patient has had a workup in the past apparently and no  clear etiology of her dizziness. She has no focal deficits to suggest a neurological event. She does not appear to be in atrial fibrillation at this time. She is bradycardic, which could be contributing, but looking back at her EKG it does appear that she has had bradycardia in the past, but in any case I am going to hold her metoprolol, check orthostatics, have cardiology see the patient in consultation, order some troponins, monitor the patient on telemetry. Further workup after cardiology consultation, but no aggressive management as per my discussion with the daughter.  2.  Hematoma of the left arm after a fall. We will hold aspirin for now. Follow hemoglobin. 3.  Essential hypertension. For now, holding metoprolol and we will follow her blood pressure carefully.  4.  Borderline diabetes. The patient will be on sliding scale insulin, ADA diet.  5.  Mild cognitive impairment. We will continue with Exelon patch.  6.  Osteoporosis. We will continue with calcium.  7.  Chronic obstructive pulmonary disease. We will continue with her inhalers and O2 p.r.n.  8.  Code the patient is a full code status.   TIME SPENT: Approximately 50 minutes.   ____________________________ Janyth Contes. Juliene Pina, MD spm:TT D: 11/13/2014 15:46:46 ET T: 11/13/2014 16:56:55 ET JOB#: [id number]  cc: Raegan Winders  P. Juliene Pina, MD, <Dictator> Antonieta Iba, MD Janyth Contes Keishla Oyer MD ELECTRONICALLY SIGNED 11/13/2014 18:11

## 2015-04-22 NOTE — H&P (Signed)
PATIENT NAME:  Evelyn IshiharaBAKER, Claribel MR#:  [id number] DATE OF BIRTH:  04/09/1921  ADDENDUM  Tobacco dependence. The patient is encouraged to stop smoking. She does not want to quit smoking. She says she is 81 years old. The patient was counseled for 3 minutes.    ____________________________ Janyth ContesSital P. Juliene PinaMody, MD spm:LT D: 11/13/2014 15:51:04 ET T: 11/13/2014 16:31:23 ET JOB#: [id number]  cc: Amran Malter P. Juliene PinaMody, MD, <Dictator> Janyth ContesSITAL P [place] MD ELECTRONICALLY SIGNED 11/13/2014 17:16

## 2015-04-22 NOTE — Discharge Summary (Signed)
PATIENT NAME:  Evelyn IshiharaBAKER, Fanny MR#:  [id number] DATE OF BIRTH:  [birth year]  DATE OF ADMISSION:  10/05/2014 DATE OF DISCHARGE:  10/06/2014  ADMISSION DIAGNOSES: 1.  Atrial fibrillation, rapid ventricular response.  2.  Dementia.  3.  History of chronic obstructive pulmonary disease, not acute exacerbation.   DISCHARGE DIAGNOSES: 1.  Atrial fibrillation, rapid ventricular response, converted to normal sinus rhythm.  2.  Dementia.  3.  History of chronic obstructive pulmonary disease.  4.  History of falls.  5.  Elevated troponins.   CONSULTATIONS: Dr. Gwen PoundsKowalski.   DIAGNOSTIC DATA: Discharge laboratory: White blood cells 6.7, hemoglobin 12, hematocrit 37, platelets 157,000. Sodium 141, potassium 3.9, chloride 106, bicarb 28, BUN 24, creatinine 1.04, glucose 129. CPK-MB was 3. Troponin at discharge was 0.7.   HOSPITAL COURSE: This is a 81 year old female with a history of dementia and multiple falls who presented after a fall, found to have A-fib and RVR. For further details, please refer to the H and P.  1.  Atrial fibrillation and RVR. The patient had an incidental finding of non-valvular paroxysmal atrial fibrillation with RVR on admission. It has now converted to normal sinus rhythm with minimal medication management, including very low dose of beta blocker. She has been in normal sinus rhythm for pretty much her hospitalization. Dr. Gwen PoundsKowalski saw the patient in consultation. Due to her history of falls and her age anticoagulation is not recommended. She will be on low-dose beta blocker. She did have some bradycardia so that is why she is on a low dose beta blocker.  2.  Elevated troponins from demand ischemia, not acute coronary syndrome. The patient will continue on aspirin.  3.  History of COPD without acute exacerbation, which was stable.  4.  History of dementia. The patient was continued on her outpatient medications.   DISCHARGE PHYSICAL EXAMINATION: VITAL SIGNS: Temperature 97.8, pulse  58, respirations 20, blood pressure 136/75, 93% on room air.  GENERAL: The patient is alert. She has dementia, not oriented to time or place.  CARDIOVASCULAR: Regular rate and rhythm, 2/6 systolic ejection murmur heard best at the left sternal border without radiation. PMI is not displaced.  LUNGS: Clear to auscultation without crackles, rales, rhonchi or wheezing. Normal to percussion.  ABDOMEN: Bowel sounds positive. Nontender, nondistended. No hepatosplenomegaly.  EXTREMITIES: No clubbing, cyanosis, or edema.  NEUROLOGIC: Cranial nerves II through XII are intact.   DISCHARGE MEDICATIONS: 1.  Alendronate 70 mg weekly, on Saturday.  2.  Citalopram 20 mg daily.  3.  Exelon 4.6 transdermal patch daily.  4.  Spiriva 18 mcg daily.  5.  Advair Diskus 250/50 b.i.d.  6.  Brimonidine ophthalmic solution t.i.d.  7.  Calcium carbonate 1 tablet b.i.d.  8.  Omeprazole 20 mg daily.  9.  Ferrous sulfate 325 t.i.d.  10.  Atorvastatin 40 mg at bedtime.  11.  MS Contin 15 mg q. 12 p.r.n. pain.  12.  Aspirin 81 mg daily.  13.  Metoprolol 12.5 mg b.i.d.   DISCHARGE DIET: Low sodium.   DISCHARGE ACTIVITY: As tolerated.  DISCHARGE FOLLOWUP: The patient will follow up with Dr. Gwen PoundsKowalski in 2 weeks.   TIME SPENT: 35 minutes. The patient was stable for discharge.   ____________________________ Faren Florence P. Juliene PinaMody, MD spm:sb D: 10/06/2014 08:16:39 ET T: 10/06/2014 08:46:47 ET JOB#: [id number]  cc: Zanya Lindo P. Juliene PinaMody, MD, <Dictator> Lamar BlinksBruce J. Kowalski, MD Ahava Kissoon P Dalaya Suppa MD ELECTRONICALLY SIGNED 10/06/2014 12:01

## 2015-04-22 NOTE — Discharge Summary (Signed)
PATIENT NAME:  Evelyn Taylor, Evelyn Taylor MR#:  [id number] DATE OF BIRTH:  May 23, 1921  DATE OF ADMISSION:  11/13/2014 DATE OF DISCHARGE:  11/16/2014  PRESENTING COMPLAINT: Syncope and fall.   DISCHARGE DIAGNOSES: 1.  Dizziness with syncope. 2.  Hematoma on the left arm after fall.  3.  Essential hypertension.  4.  Borderline diabetes.  5.  Mild cognitive impairment.  6.  Osteoporosis.  7.  Chronic obstructive pulmonary disease.   CODE STATUS: FULL code.   HOME HEALTH: Physical therapy.  DISCHARGE MEDICATIONS: 1.  Alendronate 70 mg p.o. weekly on Sunday.  2.  Citalopram 20 mg daily.  3.  Exelon patch daily.  4.  Advair 250/50 one puff b.i.d.  5.  Brimonidine 0.2% ophthalmic drops 3 times a day.  6.  Calcium carbonate 1 tablet b.i.d.  7.  Omeprazole 20 mg daily. 8.  Ferrous sulfate 325 mg 1 tablet 3 times a day.  9.  Atorvastatin 40 mg at bedtime.  10.  Aspirin 81 mg daily.  11.  Spiriva 18 mcg inhalation daily.  12.  Morphine extended release 15 mg 1 tablet at bedtime as needed.  13.  Robitussin 2 tsp full every 6 hours as needed.  14.  Amlodipine 5 mg daily.   NOTE: The patient advised to stop taking metoprolol.   DISCHARGE FOLLOWUP: Follow up with Dr. Mariah MillingGollan in 1 to 2 weeks.   BRIEF SUMMARY OF HOSPITAL COURSE: [name] is a 81 year old Caucasian female with history of COPD and ongoing tobacco abuse along with history of recent diagnosis of atrial fibrillation came in with dizziness and a presyncopal episode with fall. She was admitted with:  1.  Dizziness and presyncopal episode with fall. She had recent work-up in the past. No current etiology of her dizziness. She had no focal deficits to suggest neurological event. She was not in A-fib. She was mildly bradycardic, hence beta blockers were discontinued. She received IV fluids. Cardiology, Dr. Mariah MillingGollan, saw the patient. The patient is currently on low-dose amlodipine for her elevated blood pressure. She seems to have labile hypertension.   2.  Hematoma of the left arm after fall. Hemoglobin stable. Resumed aspirin.  3.  Essential hypertension. Started on p.o. amlodipine. Discontinued beta blockers. She had 1 episode of hypotension and she received IV fluids overnight and felt better.  4.  Borderline diabetes. Continued sliding scale.  5.  Mild cognitive impairment. Continued Exelon patch.  6.  Osteoporosis. Continued calcium and alendronate weekly.  7.  COPD. Continued her inhalers.  8.  The patient was advised smoking cessation. She is not keen on it.   The patient will be discharged to assisted living along with home health PT. Discharge plan was discussed with her daughter.   TIME SPENT: 40 minutes. ____________________________ Wylie HailSona A. Allena KatzPatel, MD sap:sb D: 11/17/2014 07:26:12 ET T: 11/17/2014 08:48:41 ET JOB#: [id number]  cc: Etienne Millward A. Allena KatzPatel, MD, <Dictator> Willow OraSONA A Brent Noto MD ELECTRONICALLY SIGNED 12/01/2014 14:06

## 2015-09-11 ENCOUNTER — Other Ambulatory Visit: Payer: Self-pay | Admitting: Otolaryngology

## 2015-09-11 DIAGNOSIS — R1312 Dysphagia, oropharyngeal phase: Secondary | ICD-10-CM

## 2015-09-13 ENCOUNTER — Ambulatory Visit (INDEPENDENT_AMBULATORY_CARE_PROVIDER_SITE_OTHER): Payer: Medicare Other | Admitting: Cardiovascular Disease

## 2015-09-13 ENCOUNTER — Encounter: Payer: Self-pay | Admitting: Cardiovascular Disease

## 2015-09-13 VITALS — BP 132/62 | HR 64 | Ht 64.0 in | Wt 165.2 lb

## 2015-09-13 DIAGNOSIS — I480 Paroxysmal atrial fibrillation: Secondary | ICD-10-CM | POA: Diagnosis not present

## 2015-09-13 DIAGNOSIS — R296 Repeated falls: Secondary | ICD-10-CM | POA: Diagnosis not present

## 2015-09-13 DIAGNOSIS — I10 Essential (primary) hypertension: Secondary | ICD-10-CM

## 2015-09-13 NOTE — Patient Instructions (Signed)
You are doing well. No medication changes were made.  Please call us if you have new issues that need to be addressed before your next appt.  Your physician wants you to follow-up in: 12 months.  You will receive a reminder letter in the mail two months in advance. If you don't receive a letter, please call our office to schedule the follow-up appointment. 

## 2015-09-13 NOTE — Assessment & Plan Note (Signed)
Maintaining normal sinus rhythm on today's visit. No changes to her medications. 

## 2015-09-13 NOTE — Progress Notes (Signed)
Patient ID: Evelyn Taylor, female    DOB: 15-Jun-1921, 81 y.o.   MRN: [id number]  HPI Comments: Evelyn Taylor is a very pleasant 81 year old woman who presents from [facility] living with a history of hypertension, long history of smoking from age 73 to age 87, dementia,  on oxygen, who presents for routine followup of her blood pressure and medication adjustment. History of falls  In follow up today, daughter reports that she is doing well. No recent falls Blood pressure measurements provided from the nursing facility show systolic pressure 109 up to 116 This is measured once per week. She denies any orthostasis Active, severe hearing loss. Denies any leg edema him a shortness of breath with exertion Daughter reports that she has a chronic cough. Nursing home has been pureing her meat.   because of this she has not been eating much   EKG on today's visit shows normal sinus rhythm with rate 64 bpm, no significant ST or T-wave changes   other past medical history   fall 11/13/2014. Son reports that she lost her balance while at the nursing home. She's had several falls She had echocardiogram showing ejection fraction 55%, moderate LVH EKG showed sinus bradycardia with rate 50 bpm, normal sinus rhythm CT scan of the head showing no acute fracture, plaque noted in the right carotid artery CT scan of the spine showed no fracture atherosclerotic plaque in the left greater than the right carotid  Chlorthalidone and losartan held in the past for low blood pressure  Recent fall 10/05/2014, found to be bradycardic with heart rates in the low 50s  Given IV fluids in the emergency room, converted to atrial fibrillation . Started on low-dose metoprolol and converted back to normal sinus rhythm .  She's been home now for 3 weeks and denies having any problems. No recent falls, no lightheadedness or dizziness, no tachycardia or palpitations Notes provided from the nursing facility report systolic  pressure 130 up to 160  Prior echocardiogram showing normal LV systolic function ejection fraction 60%, moderate TR, biventricular systolic pressure 40-45 mm mercury  No Known Allergies  Current Outpatient Prescriptions on File Prior to Visit  Medication Sig Dispense Refill  . alendronate (FOSAMAX) 70 MG tablet Take 70 mg by mouth every 7 (seven) days. Take with a full glass of water on an empty stomach.    Marland Kitchen amLODipine (NORVASC) 5 MG tablet Take 5 mg by mouth daily.     Marland Kitchen aspirin 81 MG tablet Take 81 mg by mouth daily.    Marland Kitchen atorvastatin (LIPITOR) 40 MG tablet Take 40 mg by mouth daily.    . brimonidine (ALPHAGAN) 0.2 % ophthalmic solution Place 1 drop into both eyes 3 (three) times daily.    . Calcium Carbonate-Vitamin D 600-400 MG-UNIT per tablet Take 1 tablet by mouth daily.    . citalopram (CELEXA) 20 MG tablet Take 20 mg by mouth daily.    . ferrous sulfate 325 (65 FE) MG tablet Take 325 mg by mouth 3 (three) times daily with meals.    . Fluticasone-Salmeterol (ADVAIR DISKUS) 250-50 MCG/DOSE AEPB Inhale 1 puff into the lungs every 12 (twelve) hours.    Marland Kitchen omeprazole (PRILOSEC) 20 MG capsule Take 20 mg by mouth every other day.     . OXYGEN Inhale into the lungs at bedtime.    . rivastigmine (EXELON) 4.6 mg/24hr Place 1 patch onto the skin daily.    Marland Kitchen tiotropium (SPIRIVA HANDIHALER) 18 MCG inhalation capsule Place 18 mcg  into inhaler and inhale daily.     No current facility-administered medications on file prior to visit.    Past Medical History  Diagnosis Date  . Unspecified glaucoma   . Chronic airway obstruction, not elsewhere classified   . Other and unspecified hyperlipidemia   . Unspecified essential hypertension   . Senile osteoporosis   . Osteoarthrosis, unspecified whether generalized or localized, unspecified site   . Depressive disorder, not elsewhere classified   . Polyglandular dysfunction, unspecified   . Alzheimer's disease   . Obstructive chronic bronchitis  with exacerbation   . Dyspepsia and other specified disorders of function of stomach   . Other seborrheic keratosis   . Osteoporosis, unspecified   . Esophageal reflux   . Persistent disorder of initiating or maintaining sleep   . Tobacco use disorder   . Abnormality of gait   . Acute pharyngitis   . Syncope and collapse   . Hypopotassemia   . Hypotension, unspecified   . Other emphysema   . Unspecified hypertensive kidney disease with chronic kidney disease stage I through stage IV, or unspecified   . CHF (congestive heart failure)   . Chronic kidney disease, stage III (moderate)   . Arrhythmia   . A-fib     History reviewed. No pertinent past surgical history.  Social History  reports that she has quit smoking. Her smoking use included Cigarettes. She has a 45 pack-year smoking history. She does not have any smokeless tobacco history on file. She reports that she does not drink alcohol or use illicit drugs.  Family History family history is not on file.   Review of Systems  Constitutional: Negative.   Respiratory: Negative.   Cardiovascular: Negative.   Gastrointestinal: Negative.   Musculoskeletal: Positive for arthralgias.  Skin: Negative.   Neurological: Negative.   Hematological: Negative.   Psychiatric/Behavioral: Negative.   All other systems reviewed and are negative.   BP 132/62 mmHg  Pulse 64  Ht  (1.626 m)  Wt 165 lb 4 oz (74.957 kg)  BMI 28.35 kg/m2  Physical Exam  Constitutional: She is oriented to person, place, and time. She appears well-developed and well-nourished.  On examination of her left axillary area, she has a large hematoma the size of a small orange. Tenderness with palpation. Some mild limitation in her range of motion of her left shoulder secondary to discomfort  HENT:  Head: Normocephalic.  Nose: Nose normal.  Mouth/Throat: Oropharynx is clear and moist.  Eyes: Conjunctivae are normal. Pupils are equal, round, and reactive to  light.  Neck: Normal range of motion. Neck supple. No JVD present.  Cardiovascular: Normal rate, regular rhythm, S1 normal, S2 normal, normal heart sounds and intact distal pulses.  Exam reveals no gallop and no friction rub.   No murmur heard. Pulmonary/Chest: Effort normal and breath sounds normal. No respiratory distress. She has no wheezes. She has no rales. She exhibits no tenderness.  Abdominal: Soft. Bowel sounds are normal. She exhibits no distension. There is no tenderness.  Musculoskeletal: Normal range of motion. She exhibits no edema or tenderness.  Lymphadenopathy:    She has no cervical adenopathy.  Neurological: She is alert and oriented to person, place, and time. Coordination normal.  Skin: Skin is warm and dry. No rash noted. No erythema.  Psychiatric: She has a normal mood and affect. Her behavior is normal. Judgment and thought content normal.    Assessment and Plan  Nursing note and vitals reviewed.

## 2015-09-13 NOTE — Assessment & Plan Note (Signed)
Blood pressure is well controlled on today's visit. No changes made to the medications. 

## 2015-09-13 NOTE — Assessment & Plan Note (Signed)
No recent falls. Walks with a walker 

## 2015-09-20 ENCOUNTER — Ambulatory Visit
Admission: RE | Admit: 2015-09-20 | Discharge: 2015-09-20 | Disposition: A | Discharge status: Home / Self Care | Payer: Medicare Other | Source: Ambulatory Visit | Attending: Otolaryngology | Admitting: Otolaryngology

## 2015-09-20 DIAGNOSIS — R1312 Dysphagia, oropharyngeal phase: Secondary | ICD-10-CM | POA: Insufficient documentation

## 2015-09-20 NOTE — Therapy (Signed)
[facility] DIAGNOSTIC RADIOLOGY [address] [place], [place], [address] Phone: [phone]   Fax:     Modified Barium Swallow  Patient Details  Name: Evelyn Taylor MRN: [id number] Date of Birth: 09-24-1921 Referring Provider:  Vernie Murders, MD  Encounter Date: 09/20/2015    Past Medical History  Diagnosis Date  . Unspecified glaucoma   . Chronic airway obstruction, not elsewhere classified   . Other and unspecified hyperlipidemia   . Unspecified essential hypertension   . Senile osteoporosis   . Osteoarthrosis, unspecified whether generalized or localized, unspecified site   . Depressive disorder, not elsewhere classified   . Polyglandular dysfunction, unspecified   . Alzheimer's disease   . Obstructive chronic bronchitis with exacerbation   . Dyspepsia and other specified disorders of function of stomach   . Other seborrheic keratosis   . Osteoporosis, unspecified   . Esophageal reflux   . Persistent disorder of initiating or maintaining sleep   . Tobacco use disorder   . Abnormality of gait   . Acute pharyngitis   . Syncope and collapse   . Hypopotassemia   . Hypotension, unspecified   . Other emphysema   . Unspecified hypertensive kidney disease with chronic kidney disease stage I through stage IV, or unspecified   . CHF (congestive heart failure)   . Chronic kidney disease, stage III (moderate)   . Arrhythmia   . A-fib     No past surgical history on file.  There were no vitals filed for this visit.  Visit Diagnosis: Oropharyngeal dysphagia - Plan: DG OP Swallowing Func-Medicare/Speech Path, DG OP Swallowing Func-Medicare/Speech Path   Subjective: Patient behavior: Awake and oriented to swallowing.  Patient is very HOH, but follows directions that she can hear.  Chief complaint:  The patient's daughter reports that the patient's diet had been change to ground meats, but is not sure of the reason.  She states that the  facility reports that the patient is holding/pocketing pills in the oral cavity.   Objective:  Radiological Procedure: A videoflouroscopic evaluation of oral-preparatory, reflex initiation, and pharyngeal phases of the swallow was performed; as well as a screening of the upper esophageal phase.  I. POSTURE: Upright in MBS chair  II. VIEW:  Lateral  III. COMPENSATORY STRATEGIES: N/A  IV. BOLUSES ADMINISTERED:   Thin Liquid: 1 large gulp   Nectar-thick Liquid: 1 large gulp    Puree: 2 teaspoons   Mechanical Soft: 1/4 graham cracker in applesauce    Barium tablet  V. RESULTS OF EVALUATION: A. ORAL PREPARATORY PHASE: (The lips, tongue, and velum are observed for strength and coordination) Within normal limits for barium impregnated food/liquid presented.  Barium tablet appears to adhere or oral mucosa and required 2-3 sips of water to be transferred to back of oral cavity.        **Overall Severity Rating: WFL/minimal  B. SWALLOW INITIATION/REFLEX: (The reflex is normal if "triggered" by the time the bolus reached the base of the tongue) Within normal limits  **Overall Severity Rating: WNL  C. PHARYNGEAL PHASE: (Pharyngeal function is normal if the bolus shows rapid, smooth, and continuous transit through the pharynx and there is no pharyngeal residue after the swallow) Within normal limits  **Overall Severity Rating: WNL  D. LARYNGEAL PENETRATION: (Material entering into the laryngeal inlet/vestibule but not aspirated) None  E. ASPIRATION: None  F. ESOPHAGEAL PHASE: (Screening of the upper esophagus): No observed abnormality within the cervical esophagus.  ASSESSMENT:  This 81 year  old woman; with reported pocketing of pills and concerns for solids; is presenting with functional oropharyngeal swallowing.  Oral control of the bolus including oral hold, rotary mastication (as assessed with  graham cracker in applesauce), and anterior to posterior transfer is within normal limits.   Timing of the pharyngeal swallow is within normal limits.  Pharyngeal aspects of the swallow including hyolaryngeal excursion, pharyngeal pressure generation, duration/amplitude of UES opening, and laryngeal vestibule closure at the height of the swallow are within normal limits.  There is no observed pharyngeal residue, laryngeal penetration, or tracheal aspiration.  The patient is not at risk for prandial aspiration and she appears to have adequate mastication to manage a regular diet.  A barium tablet appeared to adhere to oral mucosa; however, once transferred to the posterior of the oral cavity, the tablet moved rapidly through the pharynx into the esophagus.    Recommend giving pills one at a time with water, or several whole in applesauce (or similarly moist puree).  PLAN/RECOMMENDATIONS:  A. Diet: Regular   B. Swallowing Precautions: Standard.     C. Medication:  pills one at a time with water, or several whole in applesauce (or similarly moist                             puree).   D. Recommended consultation to N/A   E. Results and recommendations were discussed with the patient and her daughter and the final                              report will be faxed to the referring MD         G-Codes - 10/11/2015 1324    Functional Assessment Tool Used MBS   Functional Limitations Swallowing   Swallow Current Status (Z6109) At least 1 percent but less than 20 percent impaired, limited or restricted   Swallow Goal Status (U0454) At least 1 percent but less than 20 percent impaired, limited or restricted   Swallow Discharge Status [phone]) At least 1 percent but less than 20 percent impaired, limited or restricted          Problem List Patient Active Problem List   Diagnosis Date Noted  . Falls frequently 12/05/2014  . Hematoma of armpit 12/05/2014  . Atrial fibrillation 10/25/2014  . Orthostatic hypotension 12/01/2013  . Dehydration 12/01/2013  . Essential hypertension 11/01/2013   . Acute renal insufficiency 11/01/2013  . Dizziness 11/01/2013  . Dementia 11/01/2013   Dollene Primrose, MS/CCC- SLP  Leandrew Koyanagi 10/11/15, 1:26 PM  [facility] DIAGNOSTIC RADIOLOGY [address] [place], [place], [address] Phone: [phone]   Fax:

## 2016-05-16 ENCOUNTER — Emergency Department: Payer: Medicare Other

## 2016-05-16 ENCOUNTER — Emergency Department
Admission: EM | Admit: 2016-05-16 | Discharge: 2016-05-16 | Disposition: A | Discharge status: Home / Self Care | Payer: Medicare Other | Attending: Student | Admitting: Student

## 2016-05-16 ENCOUNTER — Encounter: Payer: Self-pay | Admitting: *Deleted

## 2016-05-16 DIAGNOSIS — I509 Heart failure, unspecified: Secondary | ICD-10-CM | POA: Diagnosis not present

## 2016-05-16 DIAGNOSIS — I4891 Unspecified atrial fibrillation: Secondary | ICD-10-CM | POA: Diagnosis not present

## 2016-05-16 DIAGNOSIS — N183 Chronic kidney disease, stage 3 (moderate): Secondary | ICD-10-CM | POA: Insufficient documentation

## 2016-05-16 DIAGNOSIS — Z7982 Long term (current) use of aspirin: Secondary | ICD-10-CM | POA: Insufficient documentation

## 2016-05-16 DIAGNOSIS — F329 Major depressive disorder, single episode, unspecified: Secondary | ICD-10-CM | POA: Insufficient documentation

## 2016-05-16 DIAGNOSIS — Y939 Activity, unspecified: Secondary | ICD-10-CM | POA: Insufficient documentation

## 2016-05-16 DIAGNOSIS — F0390 Unspecified dementia without behavioral disturbance: Secondary | ICD-10-CM | POA: Diagnosis not present

## 2016-05-16 DIAGNOSIS — Z79899 Other long term (current) drug therapy: Secondary | ICD-10-CM | POA: Insufficient documentation

## 2016-05-16 DIAGNOSIS — M810 Age-related osteoporosis without current pathological fracture: Secondary | ICD-10-CM | POA: Insufficient documentation

## 2016-05-16 DIAGNOSIS — Y999 Unspecified external cause status: Secondary | ICD-10-CM | POA: Diagnosis not present

## 2016-05-16 DIAGNOSIS — R4182 Altered mental status, unspecified: Secondary | ICD-10-CM | POA: Diagnosis present

## 2016-05-16 DIAGNOSIS — I130 Hypertensive heart and chronic kidney disease with heart failure and stage 1 through stage 4 chronic kidney disease, or unspecified chronic kidney disease: Secondary | ICD-10-CM | POA: Insufficient documentation

## 2016-05-16 DIAGNOSIS — Y929 Unspecified place or not applicable: Secondary | ICD-10-CM | POA: Diagnosis not present

## 2016-05-16 DIAGNOSIS — Z87891 Personal history of nicotine dependence: Secondary | ICD-10-CM | POA: Insufficient documentation

## 2016-05-16 DIAGNOSIS — W19XXXA Unspecified fall, initial encounter: Secondary | ICD-10-CM | POA: Diagnosis not present

## 2016-05-16 DIAGNOSIS — Z7951 Long term (current) use of inhaled steroids: Secondary | ICD-10-CM | POA: Insufficient documentation

## 2016-05-16 LAB — COMPREHENSIVE METABOLIC PANEL
ALBUMIN: 4.1 g/dL (ref 3.5–5.0)
ALK PHOS: 93 U/L (ref 38–126)
ALT: 18 U/L (ref 14–54)
ANION GAP: 9 (ref 5–15)
AST: 26 U/L (ref 15–41)
BILIRUBIN TOTAL: 0.4 mg/dL (ref 0.3–1.2)
BUN: 30 mg/dL — AB (ref 6–20)
CALCIUM: 9.3 mg/dL (ref 8.9–10.3)
CO2: 26 mmol/L (ref 22–32)
Chloride: 105 mmol/L (ref 101–111)
Creatinine, Ser: 1.52 mg/dL — ABNORMAL HIGH (ref 0.44–1.00)
GFR calc Af Amer: 33 mL/min — ABNORMAL LOW (ref >60)
GFR calc non Af Amer: 29 mL/min — ABNORMAL LOW (ref >60)
GLUCOSE: 135 mg/dL — AB (ref 65–99)
POTASSIUM: 3.8 mmol/L (ref 3.5–5.1)
SODIUM: 140 mmol/L (ref 135–145)
TOTAL PROTEIN: 7 g/dL (ref 6.5–8.1)

## 2016-05-16 LAB — CBC
HEMATOCRIT: 40.5 % (ref 35.0–47.0)
Hemoglobin: 13.6 g/dL (ref 12.0–16.0)
MCH: 32.2 pg (ref 26.0–34.0)
MCHC: 33.6 g/dL (ref 32.0–36.0)
MCV: 95.8 fL (ref 80.0–100.0)
Platelets: 185 10*3/uL (ref 150–440)
RBC: 4.22 MIL/uL (ref 3.80–5.20)
RDW: 13.4 % (ref 11.5–14.5)
WBC: 8.3 10*3/uL (ref 3.6–11.0)

## 2016-05-16 LAB — TROPONIN I

## 2016-05-16 NOTE — ED Notes (Signed)
Pt brought in via ems from nursing home with altered mental status.  Pt was found in the corner of a room after defacating in the floor.  Pt alert and calm on arrival to er.  Pt alert.  Sinus brady on monitor.  siderails up x 2.

## 2016-05-16 NOTE — ED Notes (Signed)

## 2016-05-16 NOTE — ED Provider Notes (Addendum)
[facility] Emergency Department Provider Note   ____________________________________________  Time seen: Approximately 9:40 PM  I have reviewed the triage vital signs and the nursing notes.   HISTORY  Chief Complaint Altered Mental Status   Caveat-history of present illness and review of systems is limited due to the patient's dementia. All information is obtained from her daughter at bedside.  HPI Evelyn Taylor is a 82 y.o. female with history of dementia, hypertension, hyperlipidemia, glaucoma, history of atrial fibrillation who presents the EMS for a fall this evening. Her daughter reports that she had just left the patient at [facility] facility after visiting her when she received a phone call that her mother had fallen. Daughter suspects that that patient got up from bed and was attempting to walk to the bathroom however she has poor vision and the room was dark and that is likely what caused her to fall. Per EMS, she had a bowel movement on the floor. Daughter reports that she has been in her usual state of health without illness, no cough, sneezing, vomiting, diarrhea, fevers or chills. She does have frequent falls. She is at her baseline in terms of mental status. The patient denies any chest pain,  abdominal pain or any pain complaints, she reports that she feels well and she wants to go home.   Past Medical History  Diagnosis Date  . Unspecified glaucoma   . Chronic airway obstruction, not elsewhere classified   . Other and unspecified hyperlipidemia   . Unspecified essential hypertension   . Senile osteoporosis   . Osteoarthrosis, unspecified whether generalized or localized, unspecified site   . Depressive disorder, not elsewhere classified   . Polyglandular dysfunction, unspecified (HCC)   . Alzheimer's disease   . Obstructive chronic bronchitis with exacerbation (HCC)   . Dyspepsia and other specified disorders of function of stomach   .  Other seborrheic keratosis   . Osteoporosis, unspecified   . Esophageal reflux   . Persistent disorder of initiating or maintaining sleep   . Tobacco use disorder   . Abnormality of gait   . Acute pharyngitis   . Syncope and collapse   . Hypopotassemia   . Hypotension, unspecified   . Other emphysema (HCC)   . Unspecified hypertensive kidney disease with chronic kidney disease stage I through stage IV, or unspecified   . CHF (congestive heart failure) (HCC)   . Chronic kidney disease, stage III (moderate)   . Arrhythmia   . A-fib [facility])     Patient Active Problem List   Diagnosis Date Noted  . Falls frequently 12/05/2014  . Hematoma of armpit 12/05/2014  . Atrial fibrillation (HCC) 10/25/2014  . Orthostatic hypotension 12/01/2013  . Dehydration 12/01/2013  . Essential hypertension 11/01/2013  . Acute renal insufficiency 11/01/2013  . Dizziness 11/01/2013  . Dementia 11/01/2013    No past surgical history on file.  Current Outpatient Rx  Name  Route  Sig  Dispense  Refill  . acetaminophen (TYLENOL) 325 MG tablet   Oral   Take 650 mg by mouth as needed.         Marland Kitchen amLODipine (NORVASC) 5 MG tablet   Oral   Take 5 mg by mouth daily.          Marland Kitchen aspirin 81 MG tablet   Oral   Take 81 mg by mouth daily.         Marland Kitchen atorvastatin (LIPITOR) 40 MG tablet   Oral   Take  40 mg by mouth daily.         . brimonidine (ALPHAGAN) 0.2 % ophthalmic solution   Both Eyes   Place 1 drop into both eyes 3 (three) times daily.         . Calcium Carbonate-Vitamin D 600-400 MG-UNIT per tablet   Oral   Take 1 tablet by mouth daily.         . citalopram (CELEXA) 20 MG tablet   Oral   Take 20 mg by mouth daily.         . ferrous sulfate 325 (65 FE) MG tablet   Oral   Take 325 mg by mouth 3 (three) times daily with meals.         . Fluticasone-Salmeterol (ADVAIR DISKUS) 250-50 MCG/DOSE AEPB   Inhalation   Inhale 1 puff into the lungs every 12 (twelve) hours.          Marland Kitchen guaiFENesin (ROBITUSSIN) 100 MG/5ML liquid   Oral   Take 200 mg by mouth every 6 (six) hours as needed for cough.          . multivitamin-lutein (OCUVITE-LUTEIN) CAPS capsule   Oral   Take 1 capsule by mouth daily.         Marland Kitchen omeprazole (PRILOSEC) 20 MG capsule   Oral   Take 20 mg by mouth every other day.          . tiotropium (SPIRIVA HANDIHALER) 18 MCG inhalation capsule   Inhalation   Place 18 mcg into inhaler and inhale daily.           Allergies Review of patient's allergies indicates no known allergies.  No family history on file.  Social History Social History  Substance Use Topics  . Smoking status: Former Smoker -- 1.00 packs/day for 45 years    Types: Cigarettes  . Smokeless tobacco: None  . Alcohol Use: No    Review of Systems Constitutional: No fever/chills Eyes: No visual changes. ENT: No sore throat. Cardiovascular: Denies chest pain. Respiratory: Denies shortness of breath. Gastrointestinal: No abdominal pain.  No nausea, no vomiting.  No diarrhea.  No constipation. Genitourinary: Negative for dysuria. Musculoskeletal: Negative for back pain. Skin: Negative for rash. Neurological: Negative for headaches, focal weakness or numbness.  10-point ROS otherwise negative.  ____________________________________________   PHYSICAL EXAM:  Filed Vitals:   05/16/16 2000 05/16/16 2144 05/16/16 2145 05/16/16 2309  BP: 135/59 154/62    Pulse: 56 64 62   Temp:    98.6 F (37 C)  TempSrc:    Oral  Resp: 21 17 19    Height:      Weight:      SpO2: 96% 96% 95%     VITAL SIGNS: ED Triage Vitals  Enc Vitals Group     BP 05/16/16 1948 134/66 mmHg     Pulse Rate 05/16/16 1948 57     Resp 05/16/16 1948 20     Temp --      Temp Source 05/16/16 1948 Oral     SpO2 05/16/16 1948 97 %     Weight 05/16/16 1948 173 lb (78.472 kg)     Height 05/16/16 1948 5\' 5"  (1.651 m)     Head Cir --      Peak Flow --      Pain Score --      Pain Loc --       Pain Edu? --      Excl. in GC? --  Constitutional: Alert and oriented to self only but not to year or situation; daughter reports this is her baseline. She is pleasantly demented, talkative, telling jokes. Well appearing and in no acute distress. Eyes: Conjunctivae are normal. PERRL. EOMI. Head: Atraumatic. Nose: No congestion/rhinnorhea. Mouth/Throat: Mucous membranes are moist.  Oropharynx non-erythematous. Neck: No stridor.  No cervical spine tenderness to palpation. Cardiovascular: Normal rate, regular rhythm. Grossly normal heart sounds.  Good peripheral circulation. Respiratory: Normal respiratory effort.  No retractions. Lungs CTAB. Gastrointestinal: Soft and nontender. No distention. No CVA tenderness. Genitourinary: deferred Musculoskeletal: No lower extremity tenderness nor edema.  No joint effusions. Pelvis stable to rock and compression, no midline T or L-spine tenderness to palpation, full active painless range of motion at the hip and knee joints bilaterally. Neurologic:  Normal speech and language. No gross focal neurologic deficits are appreciated. 5 out of 5 strength in bilateral upper and lower extremities, sensation intact to light touch throughout. Skin:  Skin is warm, dry and intact. No rash noted. Psychiatric: Mood and affect are normal. Speech and behavior are normal.  ____________________________________________   LABS (all labs ordered are listed, but only abnormal results are displayed)  Labs Reviewed  COMPREHENSIVE METABOLIC PANEL - Abnormal; Notable for the following:    Glucose, Bld 135 (*)    BUN 30 (*)    Creatinine, Ser 1.52 (*)    GFR calc non Af Amer 29 (*)    GFR calc Af Amer 33 (*)    All other components within normal limits  URINE CULTURE  CBC  TROPONIN I   ____________________________________________  EKG  ED ECG REPORT I, Gayla Doss, the attending physician, personally viewed and interpreted this ECG.   Date: 05/16/2016  EKG  Time: 19:50  Rate: 60  Rhythm: normal sinus rhythm  Axis: normal  Intervals:left anterior fascicular block  ST&T Change: No acute ST elevation. Q waves in V1, V2. LVH with repolarization abnormality. EKG unchanged from 09/13/2015.  ____________________________________________  RADIOLOGY  CXR IMPRESSION: Enlargement of cardiac silhouette with pulmonary vascular congestion.  Bibasilar atelectasis.  CT head IMPRESSION: No acute abnormality.  Atrophy and extensive chronic microvascular ischemic change.  ____________________________________________   PROCEDURES  Procedure(s) performed: None  Critical Care performed: No  ____________________________________________   INITIAL IMPRESSION / ASSESSMENT AND PLAN / ED COURSE  Pertinent labs & imaging results that were available during my care of the patient were reviewed by me and considered in my medical decision making (see chart for details).  Jeremie Abdelaziz is a 82 y.o. female with history of dementia, hypertension, hyperlipidemia, glaucoma, history of atrial fibrillation who presents the EMS for a fall this evening. On exam, she is very well-appearing and in no acute distress. Her vital signs are stable, she is afebrile. Her exam is atraumatic, she has no complaints. We'll obtain screening EKG, labs, CT head as there was a question that she may have hit her head as well as chest x-ray. We'll obtain urinalysis and reassess for disposition.  ----------------------------------------- 10:46 PM on 05/16/2016 ----------------------------------------- The patient continues to appear well. I reviewed her labs. CMP with mild creatinine elevation at 1.5 however this appears to be close to her baseline which hovers around 1.3. unremarkable CMP and troponin. Chest x-ray shows some some pulmonary vascular congestion but no edema, no pneumonia, no effusions. CT head negative for any acute intracranial process. Patient was not able to give  sufficient sample for urinalysis but urine culture was sent. Discussed return precautions, need for close PCP  follow-up in patient's family is comfortable with the discharge plan. DC home. ____________________________________________   FINAL CLINICAL IMPRESSION(S) / ED DIAGNOSES  Final diagnoses:  Fall, initial encounter  Dementia, without behavioral disturbance      NEW MEDICATIONS STARTED DURING THIS VISIT:  New Prescriptions   No medications on file     Note:  This document was prepared using Dragon voice recognition software and may include unintentional dictation errors.    Gayla DossEryka A Lorrayne Ismael, MD 05/16/16 [id number]  Gayla DossEryka A Happy [place], MD 05/16/16 [id number]  Gayla DossEryka A Josaiah Muhammed, MD 05/16/16 [phone]

## 2016-05-16 NOTE — ED Notes (Signed)
Family with pt.  Pt ambulated to bathroom with assistance

## 2016-05-16 NOTE — ED Notes (Signed)
Pt brought in by ems from [facility].  Ems report pt pooped in the corner of a room.  Pt has increased confusion last few weeks. Pt alert.

## 2016-05-17 LAB — URINE CULTURE

## 2016-06-23 IMAGING — CT CT HEAD WITHOUT CONTRAST
3 of 6 series · 13 of 47 positions shown, 15 images · non-contrast
Comparison: Recent prior CT scan of the head and cervical spine
10/05/2014

CLINICAL DATA: [AGE] female status post fall while getting up
and going to the bathroom. Recurrent syncope up on EMS arrival.

EXAM:
CT HEAD WITHOUT CONTRAST
CT CERVICAL SPINE WITHOUT CONTRAST
TECHNIQUE: Multidetector CT imaging of the head and cervical spine was
performed following the standard protocol without intravenous
contrast. Multiplanar CT image reconstructions of the cervical spine
were also generated.

[Series 3: head bone · axial · 0.48mm/px · z∈[-132,-99]mm · 3 of 90 slices shown]
[im 12/90  bone]
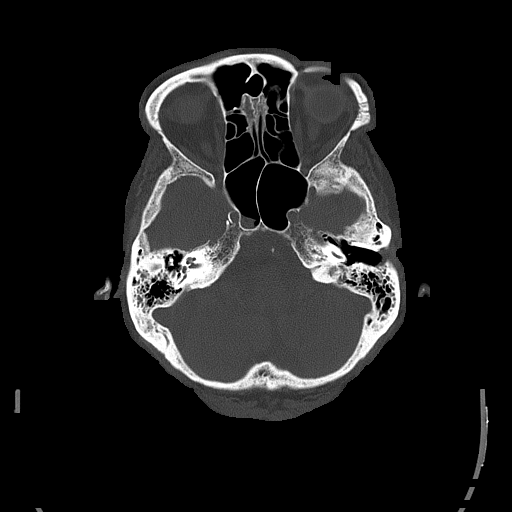
[im 23/90  bone]
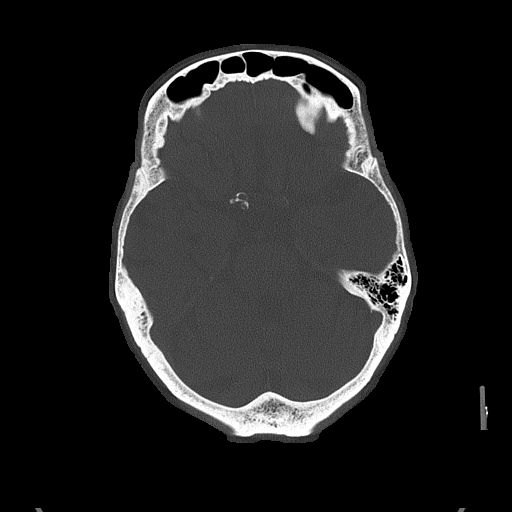
[im 34/90  bone]
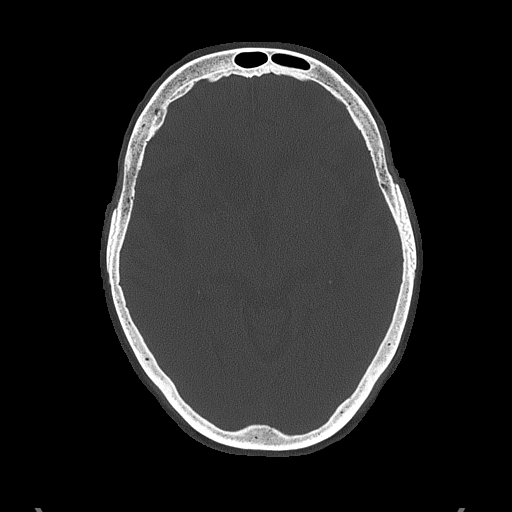

[Series 7: cor bone · coronal · 0.21mm/px · 3 of 39 slices shown]
[im 13/39  brain]
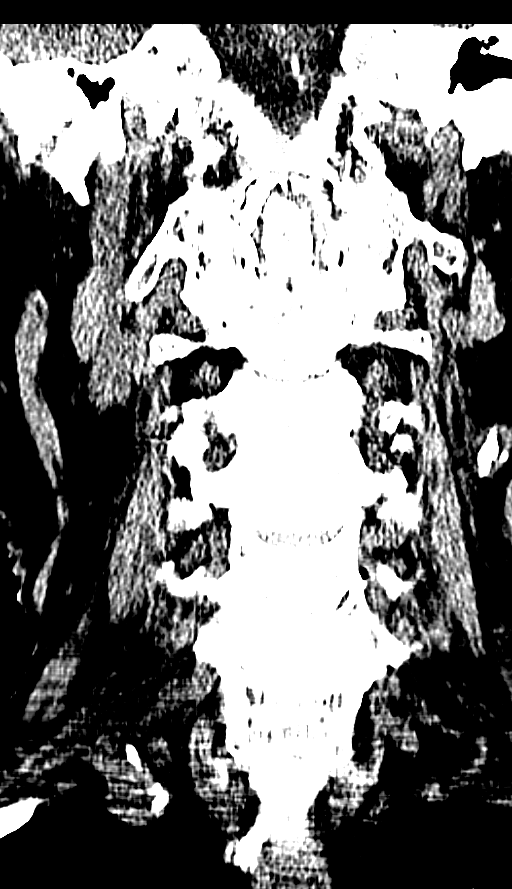
[im 17/39  brain]
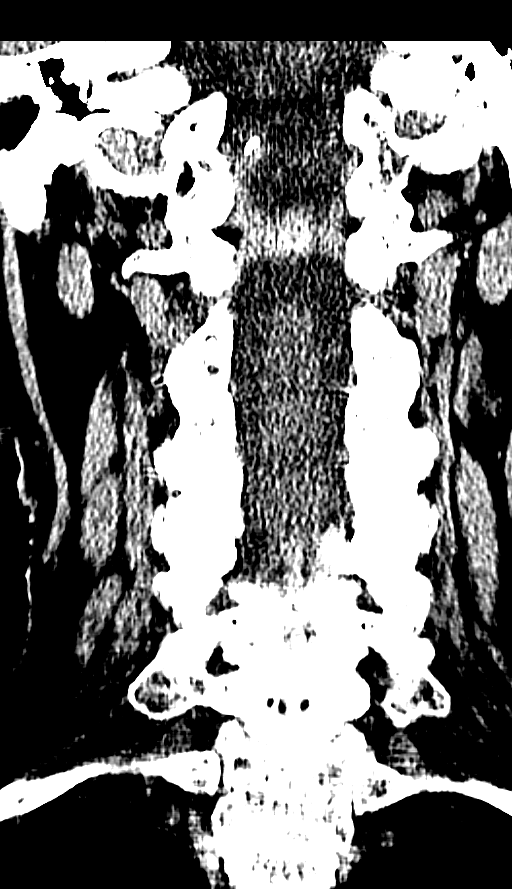
[im 22/39  brain]
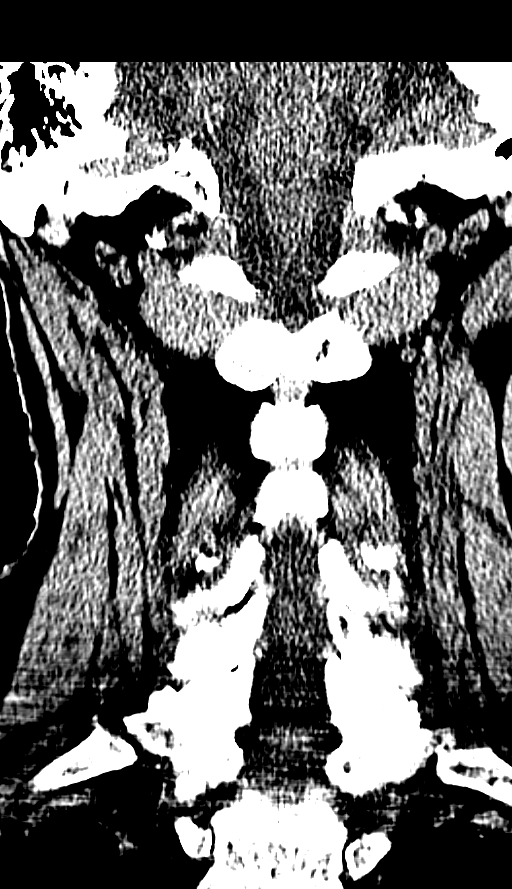

[Series 8: orthogonal axials · axial · 0.17mm/px · z∈[-285,-173]mm · 7 of 83 slices shown, 9 images]
[im 11/83  brain]
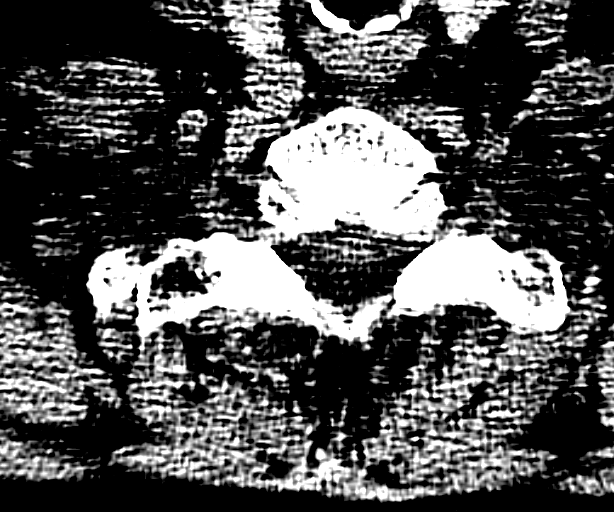
[im 11/83  bone]
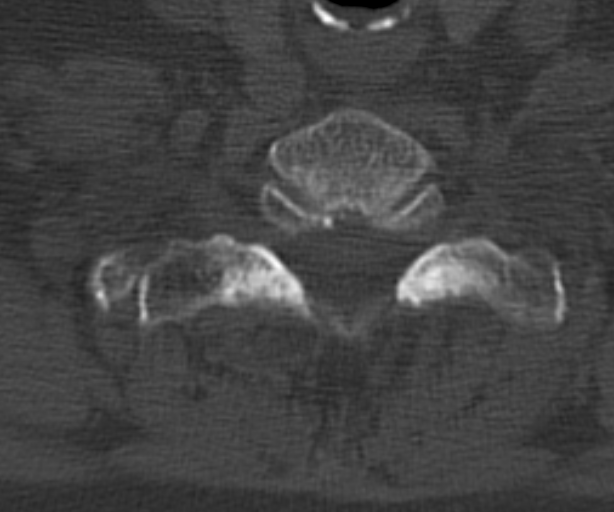
[im 21/83  brain]
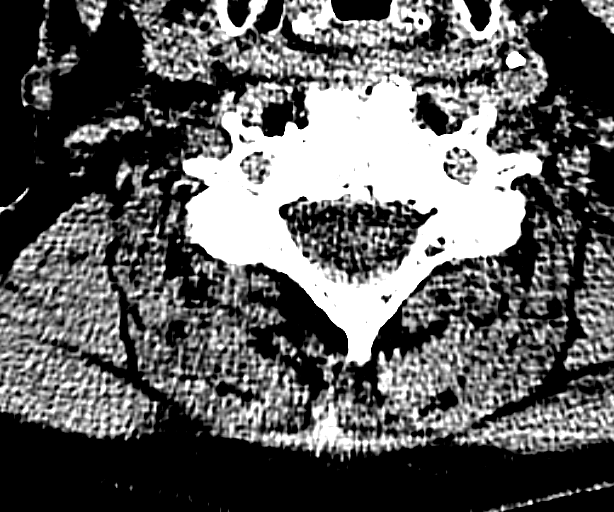
[im 31/83  brain]
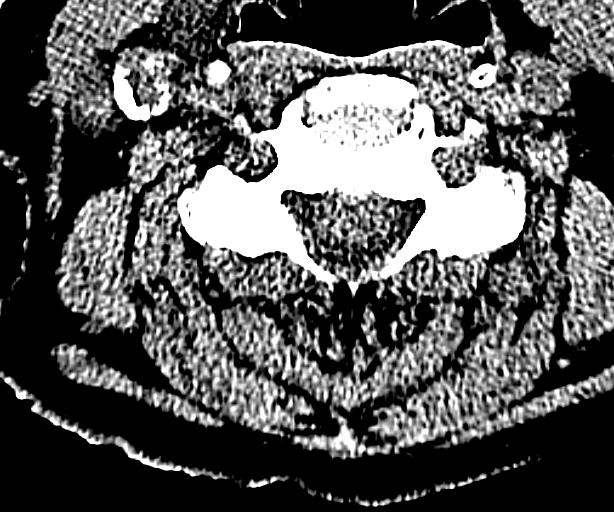
[im 42/83  brain]
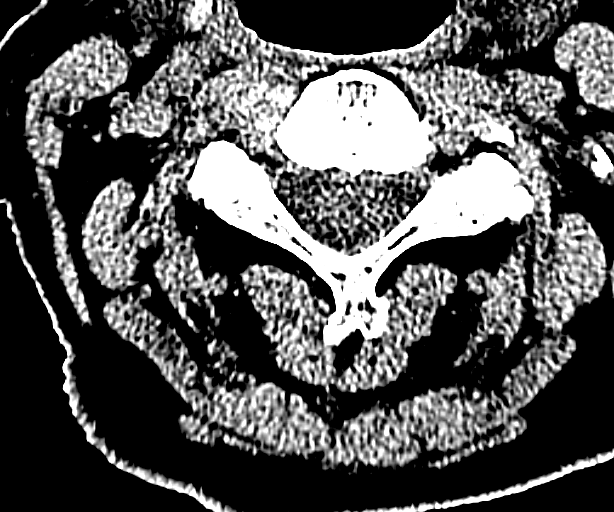
[im 52/83  brain]
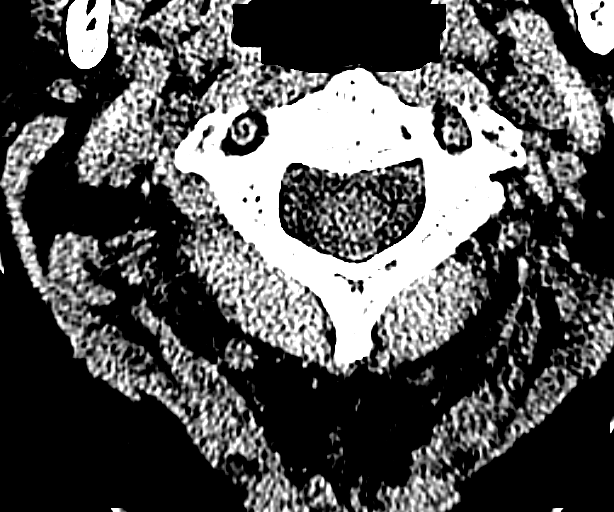
[im 52/83  bone]
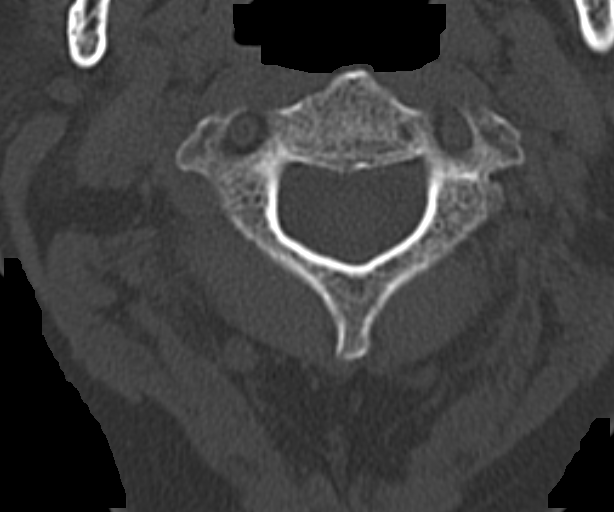
[im 62/83  brain]
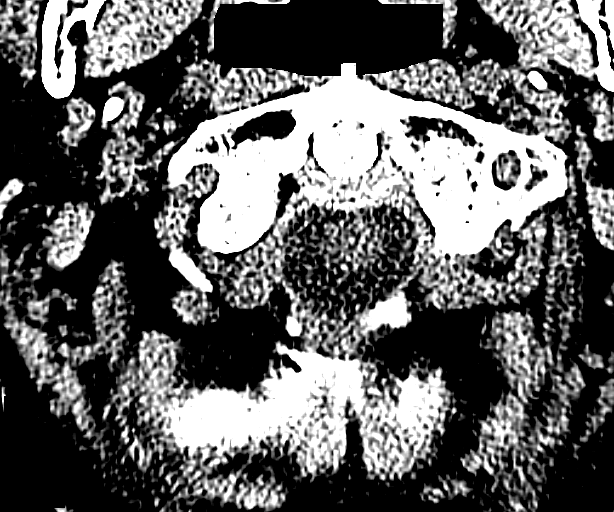
[im 72/83  brain]
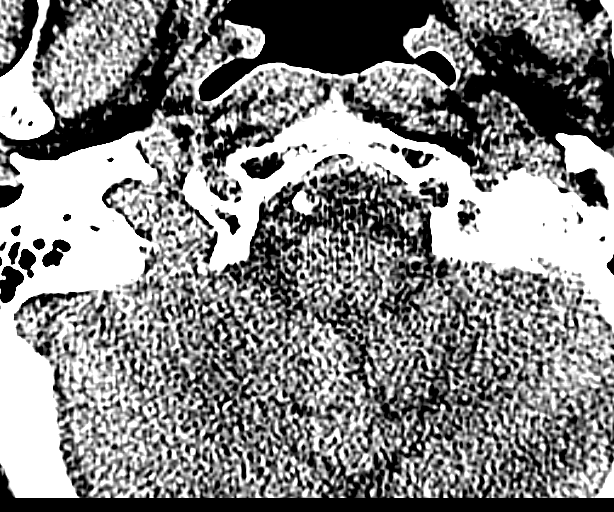

[13 of 47 positions shown; findings below may reference images not displayed]

FINDINGS: CT HEAD FINDINGS

Negative for acute intracranial hemorrhage, acute infarction, mass,
mass effect, hydrocephalus or midline shift. Gray-white
differentiation is preserved throughout. Stable pattern of severe
chronic microvascular ischemic white matter disease. Remote infarct
of the right caudate head is also unchanged. Stable atrophy and ex
vacuo ventriculomegaly with asymmetry of the frontal horns of the
lateral ventricles (larger on the right than the left).

CT CERVICAL SPINE FINDINGS

No acute fracture, malalignment or prevertebral soft tissue
swelling. Multilevel cervical spondylosis no significant at C4-C5
and C5-C6. Stable 3 mm of anterolisthesis on C4 on C5 without
significant interval change. Anterior translation is favored to be
degenerative. Advanced multilevel bilateral facet arthropathy.
Degenerative changes are also present about the atlantodental
interval. Unremarkable CT appearance of the thyroid gland. No acute
soft tissue abnormality. The lung apices are unremarkable.
Atherosclerotic calcifications in the left greater than right
carotid arteries.
IMPRESSION: CT HEAD

1. No acute intracranial abnormality.
2. Stable atrophy, severe chronic microvascular ischemic white
matter disease and asymmetric ventriculomegaly.
CT CSPINE

1. No acute fracture or malalignment.
2. Stable appearance of multilevel cervical spondylosis,
degenerative anterolisthesis of C4 on C5 and bilateral facet
arthropathy.
3. Calcified atherosclerotic plaque in the left greater than right
carotid arteries.

## 2016-07-03 ENCOUNTER — Emergency Department
Admission: EM | Admit: 2016-07-03 | Discharge: 2016-07-03 | Disposition: A | Discharge status: Home / Self Care | Payer: Medicare Other | Attending: Emergency Medicine | Admitting: Emergency Medicine

## 2016-07-03 ENCOUNTER — Emergency Department: Payer: Medicare Other

## 2016-07-03 DIAGNOSIS — S0003XA Contusion of scalp, initial encounter: Secondary | ICD-10-CM | POA: Diagnosis not present

## 2016-07-03 DIAGNOSIS — G309 Alzheimer's disease, unspecified: Secondary | ICD-10-CM | POA: Diagnosis not present

## 2016-07-03 DIAGNOSIS — J441 Chronic obstructive pulmonary disease with (acute) exacerbation: Secondary | ICD-10-CM | POA: Insufficient documentation

## 2016-07-03 DIAGNOSIS — I4891 Unspecified atrial fibrillation: Secondary | ICD-10-CM | POA: Insufficient documentation

## 2016-07-03 DIAGNOSIS — I509 Heart failure, unspecified: Secondary | ICD-10-CM | POA: Diagnosis not present

## 2016-07-03 DIAGNOSIS — Z7982 Long term (current) use of aspirin: Secondary | ICD-10-CM | POA: Diagnosis not present

## 2016-07-03 DIAGNOSIS — Z79899 Other long term (current) drug therapy: Secondary | ICD-10-CM | POA: Diagnosis not present

## 2016-07-03 DIAGNOSIS — E785 Hyperlipidemia, unspecified: Secondary | ICD-10-CM | POA: Diagnosis not present

## 2016-07-03 DIAGNOSIS — M810 Age-related osteoporosis without current pathological fracture: Secondary | ICD-10-CM | POA: Insufficient documentation

## 2016-07-03 DIAGNOSIS — I130 Hypertensive heart and chronic kidney disease with heart failure and stage 1 through stage 4 chronic kidney disease, or unspecified chronic kidney disease: Secondary | ICD-10-CM | POA: Diagnosis not present

## 2016-07-03 DIAGNOSIS — Y999 Unspecified external cause status: Secondary | ICD-10-CM | POA: Diagnosis not present

## 2016-07-03 DIAGNOSIS — Z87891 Personal history of nicotine dependence: Secondary | ICD-10-CM | POA: Diagnosis not present

## 2016-07-03 DIAGNOSIS — W07XXXA Fall from chair, initial encounter: Secondary | ICD-10-CM | POA: Insufficient documentation

## 2016-07-03 DIAGNOSIS — N184 Chronic kidney disease, stage 4 (severe): Secondary | ICD-10-CM | POA: Insufficient documentation

## 2016-07-03 DIAGNOSIS — Y929 Unspecified place or not applicable: Secondary | ICD-10-CM | POA: Diagnosis not present

## 2016-07-03 DIAGNOSIS — H748X1 Other specified disorders of right middle ear and mastoid: Secondary | ICD-10-CM | POA: Diagnosis not present

## 2016-07-03 DIAGNOSIS — Y939 Activity, unspecified: Secondary | ICD-10-CM | POA: Insufficient documentation

## 2016-07-03 DIAGNOSIS — H6591 Unspecified nonsuppurative otitis media, right ear: Secondary | ICD-10-CM

## 2016-07-03 DIAGNOSIS — S0990XA Unspecified injury of head, initial encounter: Secondary | ICD-10-CM | POA: Diagnosis present

## 2016-07-03 DIAGNOSIS — F329 Major depressive disorder, single episode, unspecified: Secondary | ICD-10-CM | POA: Insufficient documentation

## 2016-07-03 MED ORDER — AMOXICILLIN-POT CLAVULANATE 200-28.5 MG PO CHEW: 1.0000 | CHEWABLE_TABLET | Freq: Two times a day (BID) | ORAL | Status: AC

## 2016-07-03 NOTE — ED Notes (Signed)
Per EMS, pt fell witnessed by staff.  Pt went to sit and missed chair and fell and hit back of head.  No LOC, pt at baseline.  Pt has bump to back of head.

## 2016-07-03 NOTE — ED Notes (Signed)
Pt to CT at this time.

## 2016-07-03 NOTE — ED Provider Notes (Signed)
[facility] Emergency Department Provider Note  ____________________________________________  Time seen: 5:00 AM  I have reviewed the triage vital signs and the nursing notes.   HISTORY  Chief Complaint Fall      HPI Evelyn BernhardtReba Excell Taylor is a 82 y.o. female presents via EMS from memory care facility. Per EMS the patient was attempting to sit down and "missed a chair falling backwards and striking the posterior portion of her head. Patient denies any loss of consciousness. EMS states that the staff at the facility stated that the patient did not lose consciousness.     Past Medical History  Diagnosis Date  . Unspecified glaucoma   . Chronic airway obstruction, not elsewhere classified   . Other and unspecified hyperlipidemia   . Unspecified essential hypertension   . Senile osteoporosis   . Osteoarthrosis, unspecified whether generalized or localized, unspecified site   . Depressive disorder, not elsewhere classified   . Polyglandular dysfunction, unspecified (HCC)   . Alzheimer's disease   . Obstructive chronic bronchitis with exacerbation (HCC)   . Dyspepsia and other specified disorders of function of stomach   . Other seborrheic keratosis   . Osteoporosis, unspecified   . Esophageal reflux   . Persistent disorder of initiating or maintaining sleep   . Tobacco use disorder   . Abnormality of gait   . Acute pharyngitis   . Syncope and collapse   . Hypopotassemia   . Hypotension, unspecified   . Other emphysema (HCC)   . Unspecified hypertensive kidney disease with chronic kidney disease stage I through stage IV, or unspecified   . CHF (congestive heart failure) (HCC)   . Chronic kidney disease, stage III (moderate)   . Arrhythmia   . A-fib [facility](HCC)     Patient Active Problem List   Diagnosis Date Noted  . Falls frequently 12/05/2014  . Hematoma of armpit 12/05/2014  . Atrial fibrillation (HCC) 10/25/2014  . Orthostatic hypotension 12/01/2013  .  Dehydration 12/01/2013  . Essential hypertension 11/01/2013  . Acute renal insufficiency 11/01/2013  . Dizziness 11/01/2013  . Dementia 11/01/2013    History reviewed. No pertinent past surgical history.  Current Outpatient Rx  Name  Route  Sig  Dispense  Refill  . acetaminophen (TYLENOL) 325 MG tablet   Oral   Take 650 mg by mouth as needed.         Marland Kitchen. amLODipine (NORVASC) 5 MG tablet   Oral   Take 5 mg by mouth daily.          Marland Kitchen. aspirin 81 MG tablet   Oral   Take 81 mg by mouth daily.         Marland Kitchen. atorvastatin (LIPITOR) 40 MG tablet   Oral   Take 40 mg by mouth daily.         . brimonidine (ALPHAGAN) 0.2 % ophthalmic solution   Both Eyes   Place 1 drop into both eyes 3 (three) times daily.         . Calcium Carbonate-Vitamin D 600-400 MG-UNIT per tablet   Oral   Take 1 tablet by mouth daily.         . citalopram (CELEXA) 20 MG tablet   Oral   Take 20 mg by mouth daily.         . ferrous sulfate 325 (65 FE) MG tablet   Oral   Take 325 mg by mouth 3 (three) times daily with meals.         .Marland Kitchen  Fluticasone-Salmeterol (ADVAIR DISKUS) 250-50 MCG/DOSE AEPB   Inhalation   Inhale 1 puff into the lungs every 12 (twelve) hours.         Marland Kitchen guaiFENesin (ROBITUSSIN) 100 MG/5ML liquid   Oral   Take 200 mg by mouth every 6 (six) hours as needed for cough.          . multivitamin-lutein (OCUVITE-LUTEIN) CAPS capsule   Oral   Take 1 capsule by mouth daily.         Marland Kitchen omeprazole (PRILOSEC) 20 MG capsule   Oral   Take 20 mg by mouth every other day.          . tiotropium (SPIRIVA HANDIHALER) 18 MCG inhalation capsule   Inhalation   Place 18 mcg into inhaler and inhale daily.           Allergies No known drug allergies No family history on file.  Social History Social History  Substance Use Topics  . Smoking status: Former Smoker -- 1.00 packs/day for 45 years    Types: Cigarettes  . Smokeless tobacco: None  . Alcohol Use: No    Review of  Systems  Constitutional: Negative for fever. Eyes: Negative for visual changes. ENT: Negative for sore throat. Cardiovascular: Negative for chest pain. Respiratory: Negative for shortness of breath. Gastrointestinal: Negative for abdominal pain, vomiting and diarrhea. Genitourinary: Negative for dysuria. Musculoskeletal: Negative for back pain. Skin: Negative for rash. Neurological: Negative for headaches, focal weakness or numbness.   10-point ROS otherwise negative.  ____________________________________________   PHYSICAL EXAM:  VITAL SIGNS: ED Triage Vitals  Enc Vitals Group     BP 07/03/16 0502 177/81 mmHg     Pulse Rate 07/03/16 0502 65     Resp 07/03/16 0502 18     Temp 07/03/16 0502 98.2 F (36.8 C)     Temp Source 07/03/16 0502 Oral     SpO2 07/03/16 0502 96 %     Weight --      Height --      Head Cir --      Peak Flow --      Pain Score 07/03/16 0459 10     Pain Loc --      Pain Edu? --      Excl. in GC? --      Constitutional: Alert and oriented. Well appearing and in no distress. Eyes: Conjunctivae are normal. PERRL. Normal extraocular movements. ENT   Head: Normocephalic and atraumatic.   Nose: No congestion/rhinnorhea.   Mouth/Throat: Mucous membranes are moist.   Neck: No stridor. Hematological/Lymphatic/Immunilogical: No cervical lymphadenopathy. Cardiovascular: Normal rate, regular rhythm. Normal and symmetric distal pulses are present in all extremities. No murmurs, rubs, or gallops. Respiratory: Normal respiratory effort without tachypnea nor retractions. Breath sounds are clear and equal bilaterally. No wheezes/rales/rhonchi. Gastrointestinal: Soft and nontender. No distention. There is no CVA tenderness. Genitourinary: deferred Musculoskeletal: Nontender with normal range of motion in all extremities. No joint effusions.  No lower extremity tenderness nor edema. Neurologic:  Normal speech and language. No gross focal neurologic  deficits are appreciated. Speech is normal.  Skin:  Skin is warm, dry and intact. No rash noted. 2 X 2cm right occipital swelling Psychiatric: Mood and affect are normal. Speech and behavior are normal. Patient exhibits appropriate insight and judgment.    RADIOLOGY   CT Head Wo Contrast (Final result) Result time: 07/03/16 05:26:33   Final result by Rad Results In Interface (07/03/16 05:26:33)   Narrative:   CLINICAL DATA:  Witnessed fall by staff. Missed chair while sitting down and struck back of head. No loss of consciousness. History of Alzheimer's disease.  EXAM: CT HEAD WITHOUT CONTRAST  TECHNIQUE: Contiguous axial images were obtained from the base of the skull through the vertex without intravenous contrast.  COMPARISON: CT HEAD May 16, 2016  FINDINGS: INTRACRANIAL CONTENTS: No intraparenchymal hemorrhage, mass effect nor midline shift. Confluent supratentorial white matter hypodensities. Old RIGHT basal ganglia infarct with ex vacuo dilatation of RIGHT frontal horn of the lateral ventricle. No hydrocephalus. No acute large vascular territory infarcts. No abnormal extra-axial fluid collections. Basal cisterns are patent. Moderate to severe calcific atherosclerosis of the carotid siphons.  ORBITS: The included ocular globes and orbital contents are non-suspicious. Status post RIGHT ocular lens implant.  SINUSES: Bilateral maxillary sinus mucoperiosteal reaction. RIGHT middle ear and mastoid effusion is worse.  SKULL/SOFT TISSUES: No skull fracture. No significant soft tissue swelling.  IMPRESSION: No acute intracranial process.  Stable examination including moderate to severe chronic small vessel ischemic disease and old RIGHT basal ganglia infarct.  Worsening RIGHT middle ear and mastoid effusion.   Electronically Signed By: Awilda Metroourtnay Bloomer M.D. On: 07/03/2016 05:26       Procedures      INITIAL IMPRESSION / ASSESSMENT AND PLAN / ED  COURSE  Pertinent labs & imaging results that were available during my care of the patient were reviewed by me and considered in my medical decision making (see chart for details).    ____________________________________________   FINAL CLINICAL IMPRESSION(S) / ED DIAGNOSES  Final diagnoses:  Scalp contusion, initial encounter  Middle ear effusion, right      Darci Currentandolph N Amor Hyle, MD 07/03/16 [phone]

## 2016-07-03 NOTE — Discharge Instructions (Signed)

## 2016-07-03 NOTE — ED Notes (Signed)
Pt discharged to home.  Family member driving.  Discharge instructions reviewed.  Verbalized understanding.  No questions or concerns at this time.  Teach back verified.  Pt in NAD.  No items left in ED.   

## 2017-01-24 ENCOUNTER — Telehealth: Payer: Self-pay | Admitting: Cardiovascular Disease

## 2017-01-24 NOTE — Telephone Encounter (Signed)
3 attempts to schedule fu from recall.  Deleting recall.  °

## 2021-07-04 ENCOUNTER — Emergency Department: Payer: Medicare Other

## 2021-07-04 ENCOUNTER — Other Ambulatory Visit: Payer: Self-pay

## 2021-07-04 ENCOUNTER — Observation Stay
Admission: EM | Admit: 2021-07-04 | Discharge: 2021-07-05 | Disposition: A | Discharge status: Home / Self Care | Payer: Medicare Other | Attending: Internal Medicine | Admitting: Internal Medicine

## 2021-07-04 DIAGNOSIS — N289 Disorder of kidney and ureter, unspecified: Secondary | ICD-10-CM

## 2021-07-04 DIAGNOSIS — Z7982 Long term (current) use of aspirin: Secondary | ICD-10-CM | POA: Insufficient documentation

## 2021-07-04 DIAGNOSIS — R7989 Other specified abnormal findings of blood chemistry: Secondary | ICD-10-CM | POA: Diagnosis present

## 2021-07-04 DIAGNOSIS — Z79899 Other long term (current) drug therapy: Secondary | ICD-10-CM | POA: Insufficient documentation

## 2021-07-04 DIAGNOSIS — Z87891 Personal history of nicotine dependence: Secondary | ICD-10-CM | POA: Diagnosis not present

## 2021-07-04 DIAGNOSIS — Z66 Do not resuscitate: Secondary | ICD-10-CM | POA: Diagnosis not present

## 2021-07-04 DIAGNOSIS — E1122 Type 2 diabetes mellitus with diabetic chronic kidney disease: Secondary | ICD-10-CM | POA: Diagnosis not present

## 2021-07-04 DIAGNOSIS — Z515 Encounter for palliative care: Secondary | ICD-10-CM | POA: Diagnosis not present

## 2021-07-04 DIAGNOSIS — I214 Non-ST elevation (NSTEMI) myocardial infarction: Secondary | ICD-10-CM | POA: Diagnosis not present

## 2021-07-04 DIAGNOSIS — I130 Hypertensive heart and chronic kidney disease with heart failure and stage 1 through stage 4 chronic kidney disease, or unspecified chronic kidney disease: Secondary | ICD-10-CM | POA: Insufficient documentation

## 2021-07-04 DIAGNOSIS — F0390 Unspecified dementia without behavioral disturbance: Secondary | ICD-10-CM | POA: Diagnosis not present

## 2021-07-04 DIAGNOSIS — I5030 Unspecified diastolic (congestive) heart failure: Secondary | ICD-10-CM | POA: Insufficient documentation

## 2021-07-04 DIAGNOSIS — R5381 Other malaise: Secondary | ICD-10-CM

## 2021-07-04 DIAGNOSIS — S0990XA Unspecified injury of head, initial encounter: Secondary | ICD-10-CM | POA: Diagnosis present

## 2021-07-04 DIAGNOSIS — I4891 Unspecified atrial fibrillation: Secondary | ICD-10-CM | POA: Diagnosis not present

## 2021-07-04 DIAGNOSIS — J449 Chronic obstructive pulmonary disease, unspecified: Secondary | ICD-10-CM | POA: Diagnosis not present

## 2021-07-04 DIAGNOSIS — Z20822 Contact with and (suspected) exposure to covid-19: Secondary | ICD-10-CM | POA: Insufficient documentation

## 2021-07-04 DIAGNOSIS — R296 Repeated falls: Secondary | ICD-10-CM | POA: Diagnosis not present

## 2021-07-04 DIAGNOSIS — I10 Essential (primary) hypertension: Secondary | ICD-10-CM | POA: Diagnosis not present

## 2021-07-04 DIAGNOSIS — R778 Other specified abnormalities of plasma proteins: Secondary | ICD-10-CM

## 2021-07-04 DIAGNOSIS — N183 Chronic kidney disease, stage 3 unspecified: Secondary | ICD-10-CM | POA: Diagnosis not present

## 2021-07-04 DIAGNOSIS — I482 Chronic atrial fibrillation, unspecified: Secondary | ICD-10-CM

## 2021-07-04 DIAGNOSIS — W19XXXA Unspecified fall, initial encounter: Secondary | ICD-10-CM

## 2021-07-04 DIAGNOSIS — S0003XA Contusion of scalp, initial encounter: Principal | ICD-10-CM | POA: Insufficient documentation

## 2021-07-04 DIAGNOSIS — W07XXXA Fall from chair, initial encounter: Secondary | ICD-10-CM | POA: Insufficient documentation

## 2021-07-04 DIAGNOSIS — S0083XA Contusion of other part of head, initial encounter: Secondary | ICD-10-CM

## 2021-07-04 LAB — CBC WITH DIFFERENTIAL/PLATELET
Abs Immature Granulocytes: 0.03 10*3/uL (ref 0.00–0.07)
Basophils Absolute: 0.1 10*3/uL (ref 0.0–0.1)
Basophils Relative: 1 %
Eosinophils Absolute: 0.2 10*3/uL (ref 0.0–0.5)
Eosinophils Relative: 3 %
HCT: 38.4 % (ref 36.0–46.0)
Hemoglobin: 12.5 g/dL (ref 12.0–15.0)
Immature Granulocytes: 0 %
Lymphocytes Relative: 14 %
Lymphs Abs: 1 10*3/uL (ref 0.7–4.0)
MCH: 32.3 pg (ref 26.0–34.0)
MCHC: 32.6 g/dL (ref 30.0–36.0)
MCV: 99.2 fL (ref 80.0–100.0)
Monocytes Absolute: 0.7 10*3/uL (ref 0.1–1.0)
Monocytes Relative: 10 %
Neutro Abs: 5.2 10*3/uL (ref 1.7–7.7)
Neutrophils Relative %: 72 %
Platelets: 249 10*3/uL (ref 150–400)
RBC: 3.87 MIL/uL (ref 3.87–5.11)
RDW: 15.2 % (ref 11.5–15.5)
WBC: 7.2 10*3/uL (ref 4.0–10.5)
nRBC: 0 % (ref 0.0–0.2)

## 2021-07-04 LAB — TROPONIN I (HIGH SENSITIVITY)
Troponin I (High Sensitivity): 50 ng/L — ABNORMAL HIGH (ref <18)
Troponin I (High Sensitivity): 49 ng/L — ABNORMAL HIGH (ref <18)

## 2021-07-04 LAB — RESP PANEL BY RT-PCR (FLU A&B, COVID) ARPGX2
Influenza A by PCR: NEGATIVE
Influenza B by PCR: NEGATIVE
SARS Coronavirus 2 by RT PCR: NEGATIVE

## 2021-07-04 LAB — BASIC METABOLIC PANEL
Anion gap: 10 (ref 5–15)
BUN: 25 mg/dL — ABNORMAL HIGH (ref 8–23)
CO2: 27 mmol/L (ref 22–32)
Calcium: 9.2 mg/dL (ref 8.9–10.3)
Chloride: 103 mmol/L (ref 98–111)
Creatinine, Ser: 0.75 mg/dL (ref 0.44–1.00)
GFR, Estimated: >60 mL/min (ref >60)
Glucose, Bld: 103 mg/dL — ABNORMAL HIGH (ref 70–99)
Potassium: 3.7 mmol/L (ref 3.5–5.1)
Sodium: 140 mmol/L (ref 135–145)

## 2021-07-04 LAB — APTT: aPTT: 28 seconds (ref 24–36)

## 2021-07-04 MED ORDER — ASPIRIN 81 MG PO CHEW
324.0000 mg | CHEWABLE_TABLET | Freq: Once | ORAL | Status: AC
  Administered 2021-07-04: 324 mg via ORAL
  Filled 2021-07-04: qty 4

## 2021-07-04 MED ORDER — ACETAMINOPHEN 325 MG PO TABS: 650.0000 mg | ORAL_TABLET | Freq: Four times a day (QID) | ORAL | Status: DC | PRN

## 2021-07-04 MED ORDER — DEXTROSE 5 % IV SOLN: INTRAVENOUS | Status: DC

## 2021-07-04 MED ORDER — LACTATED RINGERS IV BOLUS
500.0000 mL | Freq: Once | INTRAVENOUS | Status: AC
  Administered 2021-07-04: 500 mL via INTRAVENOUS

## 2021-07-04 MED ORDER — AMLODIPINE BESYLATE 5 MG PO TABS
5.0000 mg | ORAL_TABLET | Freq: Every day | ORAL | Status: DC
  Administered 2021-07-04: 5 mg via ORAL
  Filled 2021-07-04: qty 1

## 2021-07-04 MED ORDER — DIPHENHYDRAMINE HCL 50 MG/ML IJ SOLN: 25.0000 mg | INTRAMUSCULAR | Status: DC | PRN

## 2021-07-04 MED ORDER — MORPHINE SULFATE (PF) 2 MG/ML IV SOLN: 1.0000 mg | INTRAVENOUS | Status: DC | PRN

## 2021-07-04 MED ORDER — ACETAMINOPHEN 650 MG RE SUPP: 650.0000 mg | Freq: Four times a day (QID) | RECTAL | Status: DC | PRN

## 2021-07-04 MED ORDER — LORAZEPAM 2 MG/ML IJ SOLN
0.5000 mg | INTRAMUSCULAR | Status: DC | PRN
  Administered 2021-07-05: 0.5 mg via INTRAVENOUS
  Filled 2021-07-04: qty 1

## 2021-07-04 MED ORDER — POLYVINYL ALCOHOL 1.4 % OP SOLN
1.0000 [drp] | Freq: Four times a day (QID) | OPHTHALMIC | Status: DC | PRN
  Filled 2021-07-04: qty 15

## 2021-07-04 NOTE — ED Notes (Signed)
Brief checked, clean and dry.

## 2021-07-04 NOTE — ED Notes (Signed)
Sent MD Sherryll Burger message regarding pt bp

## 2021-07-04 NOTE — ED Notes (Signed)
Patient transferred from ER stretcher to hospital bed for comfort measures.  NAD noted at this time. 

## 2021-07-04 NOTE — H&P (Signed)
[place] at [facility]   PATIENT NAME: Evelyn Taylor    MR#:  [id number]  DATE OF BIRTH:  20-Sep-1921  DATE OF ADMISSION:  07/04/2021  PRIMARY CARE PHYSICIAN: Sharin Mons, MD   REQUESTING/REFERRING PHYSICIAN: Antoine Primas MD  Comes from [facility] unit  CHIEF COMPLAINT:   Chief Complaint  Patient presents with   Fall    HISTORY OF PRESENT ILLNESS:  Christain Mcraney  is a 87 y.o. female with a known history of permanent A. fib, complete bilateral blindness, Complete hearing loss, CHF, COPD, depression, severe dementia, bedbound at baseline is being admitted s/p fall at the facility.  Patient is unable to provide any history due to her severe dementia.  Her daughter is at bedside and is her healthcare power of attorney who is providing most of the information.  I have reviewed her medical records and discussed with ED provider from which majority of information is obtained along with her daughter.  Daughter reports significant weight loss over period of time.  She reports roughly about 1 pound a month gradual weight loss.  Patient had a unwitnessed fall at the facility and was found to be on the floor.  She had a hematoma on her forehead.  ED course: Underwent CT scan of the head and C-spine which did not show any acute pathology.  Chest x-ray shows no acute cardiopulmonary disease.  Troponin at 50 concerning for possible underlying cardiac disease or non-STEMI.  ECG is not concerning for any acute ischemic changes.  Had a long discussion with daughter at bedside who wants to keep patient comfortable only and transition to hospice if she qualifies.  She does not want any aggressive treatment. PAST MEDICAL HISTORY:   Past Medical History:  Diagnosis Date   A-fib [facility])    Abnormality of gait    Acute pharyngitis    Alzheimer's disease (HCC)    Arrhythmia    CHF (congestive heart failure) (HCC)    Chronic airway obstruction, not elsewhere classified    Chronic  kidney disease, stage III (moderate) (HCC)    Depressive disorder, not elsewhere classified    Dyspepsia and other specified disorders of function of stomach    Esophageal reflux    Hypopotassemia    Hypotension, unspecified    Obstructive chronic bronchitis with exacerbation (HCC)    Osteoarthrosis, unspecified whether generalized or localized, unspecified site    Osteoporosis, unspecified    Other and unspecified hyperlipidemia    Other emphysema (HCC)    Other seborrheic keratosis    Persistent disorder of initiating or maintaining sleep    Polyglandular dysfunction, unspecified (HCC)    Senile osteoporosis    Syncope and collapse    Tobacco use disorder    Unspecified essential hypertension    Unspecified glaucoma(365.9)    Unspecified hypertensive kidney disease with chronic kidney disease stage I through stage IV, or unspecified(403.90)    PAST SURGICAL HISTORY:    Cholecystectomy SOCIAL HISTORY:   Social History   Tobacco Use   Smoking status: Former    Packs/day: 1.00    Years: 45.00    Pack years: 45.00    Types: Cigarettes   Smokeless tobacco: Not on file  Substance Use Topics   Alcohol use: No   FAMILY HISTORY:  Father had heart attack in 62s, sister had ovarian cancer DRUG ALLERGIES:  No Known Allergies REVIEW OF SYSTEMS:  ROS unable to obtain due to her severe dementia MEDICATIONS AT HOME:  Prior to Admission medications   Medication Sig Start Date End Date Taking? Authorizing Provider  acetaminophen (TYLENOL) 325 MG tablet Take 650 mg by mouth as needed.   Yes [provider]  amLODipine (NORVASC) 5 MG tablet Take 5 mg by mouth daily.  11/17/14  Yes [provider]  mirtazapine (REMERON) 15 MG tablet Take 15 mg by mouth at bedtime. 06/27/21  Yes [provider]  aspirin 81 MG tablet Take 81 mg by mouth daily. Patient not taking: Reported on 07/04/2021    [provider]  atorvastatin (LIPITOR) 40 MG tablet Take 40 mg  by mouth daily. Patient not taking: Reported on 07/04/2021    [provider]  brimonidine (ALPHAGAN) 0.2 % ophthalmic solution Place 1 drop into both eyes 3 (three) times daily. Patient not taking: Reported on 07/04/2021    [provider]  Calcium Carbonate-Vitamin D 600-400 MG-UNIT per tablet Take 1 tablet by mouth daily. Patient not taking: Reported on 07/04/2021    [provider]  citalopram (CELEXA) 20 MG tablet Take 20 mg by mouth daily. Patient not taking: Reported on 07/04/2021    [provider]  ferrous sulfate 325 (65 FE) MG tablet Take 325 mg by mouth 3 (three) times daily with meals. Patient not taking: Reported on 07/04/2021    [provider]  Fluticasone-Salmeterol (ADVAIR) 250-50 MCG/DOSE AEPB Inhale 1 puff into the lungs every 12 (twelve) hours. Patient not taking: Reported on 07/04/2021    [provider]  guaiFENesin (ROBITUSSIN) 100 MG/5ML liquid Take 200 mg by mouth every 6 (six) hours as needed for cough.  Patient not taking: Reported on 07/04/2021    [provider]  multivitamin-lutein (OCUVITE-LUTEIN) CAPS capsule Take 1 capsule by mouth daily. Patient not taking: Reported on 07/04/2021    [provider]  omeprazole (PRILOSEC) 20 MG capsule Take 20 mg by mouth every other day.  Patient not taking: Reported on 07/04/2021    [provider]  tiotropium (SPIRIVA) 18 MCG inhalation capsule Place 18 mcg into inhaler and inhale daily. Patient not taking: Reported on 07/04/2021    [provider]    VITAL SIGNS:  Blood pressure (!) 184/87, pulse 73, temperature 97.8 F (36.6 C), temperature source Oral, resp. rate 16, SpO2 98 %. PHYSICAL EXAMINATION:  Physical Exam  GENERAL:  87 y.o.-year-old cachectic looking patient lying in the bed in no acute distress.  EYES: Bilateral blindness HEENT: Severe deafness in both ears, hematoma on the right frontal scalp area NECK:  Supple, no jugular venous  distention. No thyroid enlargement, no tenderness.  LUNGS: Decreased breath sounds bilaterally, no wheezing, rales,rhonchi or crepitation. No use of accessory muscles of respiration.  CARDIOVASCULAR: S1, S2 normal.  Systolic ejection murmur present ABDOMEN: Soft, benign EXTREMITIES: No pedal edema, cyanosis, or clubbing.  NEUROLOGIC: Nonfocal.  Disoriented SKIN: Hematoma noted on the right frontal scalp area LABORATORY PANEL:   CBC Recent Labs  Lab 07/04/21 1456  WBC 7.2  HGB 12.5  HCT 38.4  PLT 249   ------------------------------------------------------------------------------------------------------------------  Chemistries  Recent Labs  Lab 07/04/21 1456  NA 140  K 3.7  CL 103  CO2 27  GLUCOSE 103*  BUN 25*  CREATININE 0.75  CALCIUM 9.2   ------------------------------------------------------------------------------------------------------------------  Cardiac Enzymes No results for input(s): TROPONINI in the last 168 hours. ------------------------------------------------------------------------------------------------------------------  RADIOLOGY:  CT Head Wo Contrast  Result Date: 07/04/2021 CLINICAL DATA:  Fall from a recliner, right frontal hematoma EXAM: CT HEAD WITHOUT CONTRAST CT CERVICAL  SPINE WITHOUT CONTRAST TECHNIQUE: Multidetector CT imaging of the head and cervical spine was performed following the standard protocol without intravenous contrast. Multiplanar CT image reconstructions of the cervical spine were also generated. COMPARISON:  05/16/2016 FINDINGS: CT HEAD FINDINGS Brain: No evidence of acute infarction, hemorrhage, hydrocephalus, extra-axial collection or mass lesion/mass effect. Extensive periventricular and deep white matter hypodensity. Moderate global cerebral volume loss. Vascular: No hyperdense vessel or unexpected calcification. Skull: Normal. Negative for fracture or focal lesion. Sinuses/Orbits: No acute finding. Other: Right frontal scalp  hematoma. CT CERVICAL SPINE FINDINGS Alignment: Normal. Skull base and vertebrae: No acute fracture. No primary bone lesion or focal pathologic process. Soft tissues and spinal canal: No prevertebral fluid or swelling. No visible canal hematoma. Disc levels: Moderate to severe multilevel disc space height loss and osteophytosis, worst from C5 through C7. Upper chest: Negative. Other: None. IMPRESSION: 1. No acute intracranial pathology. 2. Advanced small-vessel white matter disease and global cerebral volume loss. 3. Right frontal scalp hematoma. 4. No fracture or static subluxation of the cervical spine. 5. Moderate to severe multilevel disc degenerative disease of the cervical spine. Electronically Signed   By: Lauralyn Primes M.D.   On: 07/04/2021 14:00   CT Cervical Spine Wo Contrast  Result Date: 07/04/2021 CLINICAL DATA:  Fall from a recliner, right frontal hematoma EXAM: CT HEAD WITHOUT CONTRAST CT CERVICAL SPINE WITHOUT CONTRAST TECHNIQUE: Multidetector CT imaging of the head and cervical spine was performed following the standard protocol without intravenous contrast. Multiplanar CT image reconstructions of the cervical spine were also generated. COMPARISON:  05/16/2016 FINDINGS: CT HEAD FINDINGS Brain: No evidence of acute infarction, hemorrhage, hydrocephalus, extra-axial collection or mass lesion/mass effect. Extensive periventricular and deep white matter hypodensity. Moderate global cerebral volume loss. Vascular: No hyperdense vessel or unexpected calcification. Skull: Normal. Negative for fracture or focal lesion. Sinuses/Orbits: No acute finding. Other: Right frontal scalp hematoma. CT CERVICAL SPINE FINDINGS Alignment: Normal. Skull base and vertebrae: No acute fracture. No primary bone lesion or focal pathologic process. Soft tissues and spinal canal: No prevertebral fluid or swelling. No visible canal hematoma. Disc levels: Moderate to severe multilevel disc space height loss and osteophytosis,  worst from C5 through C7. Upper chest: Negative. Other: None. IMPRESSION: 1. No acute intracranial pathology. 2. Advanced small-vessel white matter disease and global cerebral volume loss. 3. Right frontal scalp hematoma. 4. No fracture or static subluxation of the cervical spine. 5. Moderate to severe multilevel disc degenerative disease of the cervical spine. Electronically Signed   By: Lauralyn Primes M.D.   On: 07/04/2021 14:00   DG Chest Portable 1 View  Result Date: 07/04/2021 CLINICAL DATA:  fall EXAM: PORTABLE CHEST 1 VIEW COMPARISON:  Chest radiograph 05/16/2016 FINDINGS: Unchanged cardiomediastinal silhouette. There is no new focal airspace disease. There is no pleural effusion or visible pneumothorax. There is a skin fold overlying the left hemithorax. Diffuse osteopenia. Bilateral shoulder degenerative changes. There is no evidence of displaced rib fracture. IMPRESSION: No radiographic evidence of trauma in the chest. Electronically Signed   By: Caprice Renshaw   On: 07/04/2021 14:15   IMPRESSION AND PLAN:  87 year old female with known history of permanent A. fib, complete bilateral blindness, Complete hearing loss, CHF, COPD, depression, severe dementia, bedbound at baseline is being admitted s/p fall at the facility.  Elevated troponin Likely due to demand ischemia but cannot rule out MI.  We will do serial troponins Daughter not interested in aggressive care which is very reasonable  Unwitnessed fall Patient has  severe functional decline including complete blindness, deafness, severe dementia.  Bedbound status.  Goals of care After long discussion with patient's daughter who is her healthcare power of attorney.  She is agreeable to keep her comfortable only and transition to hospice if she qualifies.  I will request palliative care consultation for tomorrow and placed comfort care orders  Status is: Observation  The patient remains OBS appropriate and will d/c before 2  midnights.  Dispo: The patient is from:  [facility]              Anticipated d/c is to:  Hospice              Patient currently is not medically stable to d/c.   Difficult to place patient No       DVT prophylaxis:          Comfort care    Family Communication: Discussed with daughter Melody who is her healthcare power of attorney at bedside in emergency department  All the records are reviewed and case discussed with ED provider. Management plans discussed with the patient, family and they are in agreement.  CODE STATUS: DNR/comfort care  TOTAL TIME TAKING CARE OF THIS PATIENT: 45 minutes.    Delfino LovettVipul Almus Woodham M.D on 07/04/2021 at 4:32 PM   How to contact the [facility] Attending or Consulting provider 7A - 7P or covering provider during after hours 7P -7A, for this patient   Check the care team in [facility] and look for a) attending/consulting [facility] provider listed and b) the [facility] team listed Log into [email or web address] and use [facility]'s universal password to access. If you do not have the password, please contact the hospital operator. Locate the [facility] provider you are looking for under [facility] and page to a number that you can be directly reached. If you still have difficulty reaching the provider, please page the [facility] (Director on Call) for the Hospitalists listed on amion for assistance.  [facility]   CC: Primary care physician; Sharin MonsPegna, Guillaume J, MD   Note: This dictation was prepared with Dragon dictation along with smaller phrase technology. Any transcriptional errors that result from this process are unintentional.

## 2021-07-04 NOTE — ED Notes (Signed)
Pt observed resting in bed with eyes closed, rise and fall of chest noted. Cardiac monitor intact. NAD. Bed locked and low.

## 2021-07-04 NOTE — ED Triage Notes (Signed)
Pt arrives [facility] from [facility] for a slip out of recliner, hit head on floor. Hematoma noted. HOH and blind. Hx dementia. No blood thinner use. Hematoma does not appear to be from today. EMS states family wanted pt seen in ER

## 2021-07-04 NOTE — ED Provider Notes (Addendum)
[facility] Emergency Department Provider Note  ____________________________________________   None    (approximate)  I have reviewed the triage vital signs and the nursing notes.   HISTORY  Chief Complaint Fall   HPI Evelyn Taylor is a 87 y.o. 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 fib not anticoagulated, CHF, COPD, depression and fairly advanced dementia not oriented and minimally ambulatory at baseline who presents via EMS from memory care facility for further evaluation after concerns that patient had a fall.  Patient reportedly spent most of her time in recliner leaning fairly far forward and well fall was not witnessed staff reportedly heard a noise and saw patient on the floor hematoma on her forehead and think she tipped forward.  Patient is unable provide any history arrival secondary to dementia.         Past Medical History:  Diagnosis Date   A-fib [facility])    Abnormality of gait    Acute pharyngitis    Alzheimer's disease (HCC)    Arrhythmia    CHF (congestive heart failure) (HCC)    Chronic airway obstruction, not elsewhere classified    Chronic kidney disease, stage III (moderate) (HCC)    Depressive disorder, not elsewhere classified    Dyspepsia and other specified disorders of function of stomach    Esophageal reflux    Hypopotassemia    Hypotension, unspecified    Obstructive chronic bronchitis with exacerbation (HCC)    Osteoarthrosis, unspecified whether generalized or localized, unspecified site    Osteoporosis, unspecified    Other and unspecified hyperlipidemia    Other emphysema (HCC)    Other seborrheic keratosis    Persistent disorder of initiating or maintaining sleep    Polyglandular dysfunction, unspecified (HCC)    Senile osteoporosis    Syncope and collapse    Tobacco use disorder    Unspecified essential hypertension    Unspecified glaucoma(365.9)     Unspecified hypertensive kidney disease with chronic kidney disease stage I through stage IV, or unspecified(403.90)     Patient Active Problem List   Diagnosis Date Noted   Falls frequently 12/05/2014   Hematoma of armpit 12/05/2014   Atrial fibrillation (HCC) 10/25/2014   Orthostatic hypotension 12/01/2013   Dehydration 12/01/2013   Essential hypertension 11/01/2013   Acute renal insufficiency 11/01/2013   Dizziness 11/01/2013   Dementia (HCC) 11/01/2013    History reviewed. No pertinent surgical history.  Prior to Admission medications   Medication Sig Start Date End Date Taking? Authorizing Provider  acetaminophen (TYLENOL) 325 MG tablet Take 650 mg by mouth as needed.    [provider]  amLODipine (NORVASC) 5 MG tablet Take 5 mg by mouth daily.  11/17/14   [provider]  aspirin 81 MG tablet Take 81 mg by mouth daily.    [provider]  atorvastatin (LIPITOR) 40 MG tablet Take 40 mg by mouth daily.    [provider]  brimonidine (ALPHAGAN) 0.2 % ophthalmic solution Place 1 drop into both eyes 3 (three) times daily.    [provider]  Calcium Carbonate-Vitamin D 600-400 MG-UNIT per tablet Take 1 tablet by mouth daily.    [provider]  citalopram (CELEXA) 20 MG tablet Take 20 mg by mouth daily.    [provider]  ferrous sulfate 325 (65 FE) MG tablet Take 325 mg by mouth 3 (three) times daily with meals.    [provider]  Fluticasone-Salmeterol (  ADVAIR DISKUS) 250-50 MCG/DOSE AEPB Inhale 1 puff into the lungs every 12 (twelve) hours.    [provider]  guaiFENesin (ROBITUSSIN) 100 MG/5ML liquid Take 200 mg by mouth every 6 (six) hours as needed for cough.     [provider]  multivitamin-lutein (OCUVITE-LUTEIN) CAPS capsule Take 1 capsule by mouth daily.    [provider]  omeprazole (PRILOSEC) 20 MG capsule Take 20 mg by mouth every other day.     [provider]  tiotropium (SPIRIVA HANDIHALER) 18 MCG inhalation capsule Place 18 mcg into inhaler and inhale daily.    [provider]    Allergies Patient has no known allergies.  History reviewed. No pertinent family history.  Social History Social History   Tobacco Use   Smoking status: Former    Packs/day: 1.00    Years: 45.00    Pack years: 45.00    Types: Cigarettes  Substance Use Topics   Alcohol use: No   Drug use: No    Review of Systems  Review of Systems  Unable to perform ROS: Dementia     ____________________________________________   PHYSICAL EXAM:  VITAL SIGNS: ED Triage Vitals  Enc Vitals Group     BP      Pulse      Resp      Temp      Temp src      SpO2      Weight      Height      Head Circumference      Peak Flow      Pain Score      Pain Loc      Pain Edu?      Excl. in GC?    Vitals:   07/04/21 1300  BP: (!) 184/87  Pulse: 73  Resp: 16  Temp: 97.8 F (36.6 C)  SpO2: 98%   Physical Exam Vitals and nursing note reviewed.  Constitutional:      General: She is not in acute distress.    Appearance: She is well-developed.  HENT:     Head: Normocephalic.     Right Ear: External ear normal.     Left Ear: External ear normal.     Nose: Nose normal.  Eyes:     Conjunctiva/sclera: Conjunctivae normal.  Cardiovascular:     Rate and Rhythm: Normal rate and regular rhythm.     Heart sounds: No murmur heard. Pulmonary:     Effort: Pulmonary effort is normal. No respiratory distress.     Breath sounds: Normal breath sounds.  Abdominal:     Palpations: Abdomen is soft.     Tenderness: There is no abdominal tenderness.  Musculoskeletal:     Cervical back: Neck supple.  Skin:    General: Skin is warm and dry.  Neurological:     Mental Status: Mental status is at baseline. She is disoriented and confused.    Patient has approximately quarter sized hematoma on her right forehead without other evidence of trauma to the face scalp  head or neck.  PERRLA.  EOMI.  Oropharynx unremarkable.  No tenderness step-offs or deformities over the C/T/L-spine.  Patient does not follow commands.  No pain on passive range of motion of the bilateral upper or lower extremities.  No effusion deformity or other evidence of trauma extremities.  2+ radial DP pulses. ____________________________________________   LABS (all labs ordered are listed, but only abnormal results are displayed)  Labs Reviewed  BASIC  METABOLIC PANEL - Abnormal; Notable for the following components:      Result Value   Glucose, Bld 103 (*)    BUN 25 (*)    All other components within normal limits  TROPONIN I (HIGH SENSITIVITY) - Abnormal; Notable for the following components:   Troponin I (High Sensitivity) 50 (*)    All other components within normal limits  RESP PANEL BY RT-PCR (FLU A&B, COVID) ARPGX2  CBC WITH DIFFERENTIAL/PLATELET  URINALYSIS, COMPLETE (UACMP) WITH MICROSCOPIC   ____________________________________________  EKG  ECG shows normal sinus rhythm with a ventricular rate of 68, left axis deviation, unremarkable intervals with some ST elevations in the anterior leads and nonspecific changes in inferior lateral leads concerning for possible ischemia.  No other significant underlying arrhythmia. ____________________________________________  RADIOLOGY  ED MD interpretation: CT head and C-spine showed advanced small vessel white matter disease and global cerebral volume loss with right frontal scalp hematoma.  No calvarial skull fracture, C-spine fracture dislocation or intracranial hemorrhage.  There is evidence of moderate to severe multilevel disc disease of the C-spine.  Chest x-ray has no evidence of pneumothorax, rib fracture, pneumonia, acute heart failure or other clear acute intrathoracic process.  Official radiology report(s): CT Head Wo Contrast  Result Date: 07/04/2021 CLINICAL DATA:  Fall from a recliner, right frontal hematoma EXAM:  CT HEAD WITHOUT CONTRAST CT CERVICAL SPINE WITHOUT CONTRAST TECHNIQUE: Multidetector CT imaging of the head and cervical spine was performed following the standard protocol without intravenous contrast. Multiplanar CT image reconstructions of the cervical spine were also generated. COMPARISON:  05/16/2016 FINDINGS: CT HEAD FINDINGS Brain: No evidence of acute infarction, hemorrhage, hydrocephalus, extra-axial collection or mass lesion/mass effect. Extensive periventricular and deep white matter hypodensity. Moderate global cerebral volume loss. Vascular: No hyperdense vessel or unexpected calcification. Skull: Normal. Negative for fracture or focal lesion. Sinuses/Orbits: No acute finding. Other: Right frontal scalp hematoma. CT CERVICAL SPINE FINDINGS Alignment: Normal. Skull base and vertebrae: No acute fracture. No primary bone lesion or focal pathologic process. Soft tissues and spinal canal: No prevertebral fluid or swelling. No visible canal hematoma. Disc levels: Moderate to severe multilevel disc space height loss and osteophytosis, worst from C5 through C7. Upper chest: Negative. Other: None. IMPRESSION: 1. No acute intracranial pathology. 2. Advanced small-vessel white matter disease and global cerebral volume loss. 3. Right frontal scalp hematoma. 4. No fracture or static subluxation of the cervical spine. 5. Moderate to severe multilevel disc degenerative disease of the cervical spine. Electronically Signed   By: Lauralyn PrimesAlex  Bibbey M.D.   On: 07/04/2021 14:00   CT Cervical Spine Wo Contrast  Result Date: 07/04/2021 CLINICAL DATA:  Fall from a recliner, right frontal hematoma EXAM: CT HEAD WITHOUT CONTRAST CT CERVICAL SPINE WITHOUT CONTRAST TECHNIQUE: Multidetector CT imaging of the head and cervical spine was performed following the standard protocol without intravenous contrast. Multiplanar CT image reconstructions of the cervical spine were also generated. COMPARISON:  05/16/2016 FINDINGS: CT HEAD  FINDINGS Brain: No evidence of acute infarction, hemorrhage, hydrocephalus, extra-axial collection or mass lesion/mass effect. Extensive periventricular and deep white matter hypodensity. Moderate global cerebral volume loss. Vascular: No hyperdense vessel or unexpected calcification. Skull: Normal. Negative for fracture or focal lesion. Sinuses/Orbits: No acute finding. Other: Right frontal scalp hematoma. CT CERVICAL SPINE FINDINGS Alignment: Normal. Skull base and vertebrae: No acute fracture. No primary bone lesion or focal pathologic process. Soft tissues and spinal canal: No prevertebral fluid or swelling. No visible canal hematoma. Disc levels: Moderate to severe multilevel  disc space height loss and osteophytosis, worst from C5 through C7. Upper chest: Negative. Other: None. IMPRESSION: 1. No acute intracranial pathology. 2. Advanced small-vessel white matter disease and global cerebral volume loss. 3. Right frontal scalp hematoma. 4. No fracture or static subluxation of the cervical spine. 5. Moderate to severe multilevel disc degenerative disease of the cervical spine. Electronically Signed   By: Lauralyn Primes M.D.   On: 07/04/2021 14:00   DG Chest Portable 1 View  Result Date: 07/04/2021 CLINICAL DATA:  fall EXAM: PORTABLE CHEST 1 VIEW COMPARISON:  Chest radiograph 05/16/2016 FINDINGS: Unchanged cardiomediastinal silhouette. There is no new focal airspace disease. There is no pleural effusion or visible pneumothorax. There is a skin fold overlying the left hemithorax. Diffuse osteopenia. Bilateral shoulder degenerative changes. There is no evidence of displaced rib fracture. IMPRESSION: No radiographic evidence of trauma in the chest. Electronically Signed   By: Caprice Renshaw   On: 07/04/2021 14:15    ____________________________________________   PROCEDURES  Procedure(s) performed (including Critical Care):  .Critical Care  Date/Time: 07/04/2021 3:47 PM Performed by: Gilles Chiquito,  MD Authorized by: Gilles Chiquito, MD   Critical care provider statement:    Critical care time (minutes):  45   Critical care was necessary to treat or prevent imminent or life-threatening deterioration of the following conditions:  Cardiac failure   Critical care was time spent personally by me on the following activities:  Discussions with consultants, evaluation of patient's response to treatment, examination of patient, ordering and performing treatments and interventions, ordering and review of laboratory studies, ordering and review of radiographic studies, pulse oximetry, re-evaluation of patient's condition, obtaining history from patient or surrogate and review of old charts   ____________________________________________   INITIAL IMPRESSION / ASSESSMENT AND PLAN / ED COURSE      Patient presents with above-stated history and exam after an unwitnessed fall at her facility.  She is unable provide any history on arrival.  She does have evidence on exam of her right forehead hematoma without other evidence of evidence of trauma.  Given age CT head and C-spine obtained as well as an x-ray.  Given falls unwitnessed possible syncopal will obtain ECG as well.  CT head and C-spine showed advanced small vessel white matter disease and global cerebral volume loss with right frontal scalp hematoma.  No calvarial skull fracture, C-spine fracture dislocation or intracranial hemorrhage.  There is evidence of moderate to severe multilevel disc disease of the C-spine.  Chest x-ray has no evidence of pneumothorax, rib fracture, pneumonia, acute heart failure or other clear acute intrathoracic process.  ECG shows normal sinus rhythm with a ventricular rate of 68, left axis deviation, unremarkable intervals with some ST elevations in the anterior leads and nonspecific changes in inferior lateral leads concerning for possible ischemia.  No other significant underlying arrhythmia.  Given nonspecific  findings on ECG troponin obtained which is elevated at 50 concerning for NSTEMI.  Unclear if this is related to recent trauma or precipitated fall.  CBC is unremarkable.  BMP shows no significant electrode or metabolic derangements.  Given elevated troponin and multiple nonspecific findings on ECG concerning for ischemia we will give patient ASA and start on heparin and plan to observe on medicine service.      ____________________________________________   FINAL CLINICAL IMPRESSION(S) / ED DIAGNOSES  Final diagnoses:  NSTEMI (non-ST elevated myocardial infarction) (HCC)  Fall, initial encounter  Traumatic hematoma of forehead, initial encounter    Medications  aspirin chewable tablet 324 mg (has no administration in time range)  lactated ringers bolus 500 mL (has no administration in time range)  amLODipine (NORVASC) tablet 5 mg (has no administration in time range)     ED Discharge Orders     None        Note:  This document was prepared using Dragon voice recognition software and may include unintentional dictation errors.    Gilles Chiquito, MD 07/04/21 1548    Gilles Chiquito, MD 07/04/21 [phone]

## 2021-07-04 NOTE — ED Notes (Signed)
Brief dry and clean.

## 2021-07-05 DIAGNOSIS — Z7189 Other specified counseling: Secondary | ICD-10-CM | POA: Diagnosis not present

## 2021-07-05 DIAGNOSIS — F0391 Unspecified dementia with behavioral disturbance: Secondary | ICD-10-CM

## 2021-07-05 DIAGNOSIS — W19XXXA Unspecified fall, initial encounter: Secondary | ICD-10-CM

## 2021-07-05 DIAGNOSIS — R5381 Other malaise: Secondary | ICD-10-CM

## 2021-07-05 DIAGNOSIS — N183 Chronic kidney disease, stage 3 unspecified: Secondary | ICD-10-CM

## 2021-07-05 DIAGNOSIS — Z515 Encounter for palliative care: Secondary | ICD-10-CM | POA: Diagnosis not present

## 2021-07-05 DIAGNOSIS — J449 Chronic obstructive pulmonary disease, unspecified: Secondary | ICD-10-CM

## 2021-07-05 DIAGNOSIS — Z66 Do not resuscitate: Secondary | ICD-10-CM

## 2021-07-05 DIAGNOSIS — S0003XA Contusion of scalp, initial encounter: Secondary | ICD-10-CM | POA: Diagnosis not present

## 2021-07-05 DIAGNOSIS — J4489 Other specified chronic obstructive pulmonary disease: Secondary | ICD-10-CM

## 2021-07-05 LAB — URINALYSIS, COMPLETE (UACMP) WITH MICROSCOPIC
Bilirubin Urine: NEGATIVE
Glucose, UA: NEGATIVE mg/dL
Hgb urine dipstick: NEGATIVE
Ketones, ur: 5 mg/dL — AB
Leukocytes,Ua: NEGATIVE
Nitrite: NEGATIVE
Protein, ur: NEGATIVE mg/dL
Specific Gravity, Urine: 1.011 (ref 1.005–1.030)
WBC, UA: NONE SEEN WBC/hpf (ref 0–5)
pH: 6 (ref 5.0–8.0)

## 2021-07-05 NOTE — Consult Note (Signed)
Consultation Note Date: 07/05/2021   Patient Name: Evelyn Taylor  DOB: [birth year]  MRN: [id number]  Age / Sex: 87 y.o., female  PCP: Thedore Mins Valentina Shaggy, MD Referring Physician: George Hugh, MD  Reason for Consultation: Establishing goals of care  HPI/Patient Profile: 87 y.o. female  with past medical history of A. fib, complete bilateral blindness, Complete hearing loss, CHF, COPD, depression, severe dementia, and bedbound at baseline admitted on 07/04/2021 following a fall. Found to have hematoma on forehead. CT head and c spine showed no acute pathology. CXR negative. Found to have troponin at 50 - likely d/t demand ischemia. PMT consulted to discuss [place].   Clinical Assessment and Goals of Care: I have reviewed medical records including EPIC notes, labs and imaging, received report from RN, assessed the patient and then met with patients daughter Marily Lente  to discuss diagnosis prognosis, GOC, EOL wishes, disposition and options.  I introduced Palliative Medicine as specialized medical care for people living with serious illness. It focuses on providing relief from the symptoms and stress of a serious illness. The goal is to improve quality of life for both the patient and the family.  We discussed a brief life review of the patient. She shares that patient raised 5 children. 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 Daughter tells me patient loved to write and wrote pieces often. She was also an avid reader prior to vision loss.   As far as functional and nutritional status, daughter shares that patient is bedbound. She tells me she has maintained a good appetite. Daughter shares patient was recently evaluated by SLP at [facility] facility and determined to do okay with current diet - no modifications needed. She shares that patient typically knows daughter but often is not aware of  place/situation.    We discussed patient's current illness and what it means in the larger context of patient's on-going co-morbidities.  Natural disease trajectory and expectations at EOL were discussed. We discussed her progressive dementia and loss of function. Discussed failure to thrive.   I attempted to elicit values and goals of care important to the patient.  Daughter shares that patient has been clear about her wishes in the past. She has a living will.  The difference between aggressive medical intervention and comfort care was considered in light of the patient's goals of care. Daughter would like to focus on comfort measures only.   Advance directives, concepts specific to code status, artificial feeding and hydration, and rehospitalization were considered and discussed.  I completed a MOST form today. The patient and family outlined their wishes for the following treatment decisions:  Cardiopulmonary Resuscitation: Do Not Attempt Resuscitation (DNR/No CPR)  Medical Interventions: Comfort Measures: Keep clean, warm, and dry. Use medication by any route, positioning, wound care, and other measures to relieve pain and suffering. Use oxygen, suction and manual treatment of airway obstruction as needed for comfort. Do not transfer to the hospital unless comfort needs cannot be met in current location.  Antibiotics: Antibiotics if indicated  IV Fluids: IV fluids for a defined trial period  Feeding Tube: No feeding tube   Hospice services outpatient were explained and offered. Daughter is very interested in support of hospice at patient's LTC facility.   Questions and concerns were addressed. The family was encouraged to call with questions or concerns.    Primary Decision Maker NEXT OF KIN - daughter Salvatore Marvel  SUMMARY  OF RECOMMENDATIONS   - comfort measures only - dc back to LTC facility with hospice support - discussed with social work **Discharging MD please include  request for hospice support at facility in dc summary**  Code Status/Advance Care Planning: DNR  Additional Recommendations (Limitations, Scope, Preferences): Full Comfort Care  Psycho-social/Spiritual:  Desire for further Chaplaincy support:no Additional Recommendations: Education on Hospice  Prognosis:  < 6 months  Discharge Planning: [place] with Hospice      Primary Diagnoses: Present on Admission:  Elevated troponin   I have reviewed the medical record, interviewed the patient and family, and examined the patient. The following aspects are pertinent.  Past Medical History:  Diagnosis Date   A-fib ([place])    Abnormality of gait    Acute pharyngitis    Alzheimer's disease ([place])    Arrhythmia    CHF (congestive heart failure) (HCC)    Chronic airway obstruction, not elsewhere classified    Chronic kidney disease, stage III (moderate) (HCC)    Depressive disorder, not elsewhere classified    Dyspepsia and other specified disorders of function of stomach    Esophageal reflux    Hypopotassemia    Hypotension, unspecified    Obstructive chronic bronchitis with exacerbation (HCC)    Osteoarthrosis, unspecified whether generalized or localized, unspecified site    Osteoporosis, unspecified    Other and unspecified hyperlipidemia    Other emphysema (HCC)    Other seborrheic keratosis    Persistent disorder of initiating or maintaining sleep    Polyglandular dysfunction, unspecified (HCC)    Senile osteoporosis    Syncope and collapse    Tobacco use disorder    Unspecified essential hypertension    Unspecified glaucoma(365.9)    Unspecified hypertensive kidney disease with chronic kidney disease stage I through stage IV, or unspecified(403.90)    Social History   Socioeconomic History   Marital status: Divorced    Spouse name: Not on file   Number of children: Not on file   Years of education: Not on file   Highest education level: Not on file   Occupational History   Not on file  Tobacco Use   Smoking status: Former    Packs/day: 1.00    Years: 45.00    Pack years: 45.00    Types: Cigarettes   Smokeless tobacco: Not on file  Substance and Sexual Activity   Alcohol use: No   Drug use: No   Sexual activity: Not on file  Other Topics Concern   Not on file  Social History Narrative   Not on file   Social Determinants of Health   Financial Resource Strain: Not on file  Food Insecurity: Not on file  Transportation Needs: Not on file  Physical Activity: Not on file  Stress: Not on file  Social Connections: Not on file   History reviewed. No pertinent family history. Scheduled Meds: Continuous Infusions:  dextrose 20 mL/hr at 07/05/21 0149   PRN Meds:.acetaminophen **OR** acetaminophen, diphenhydrAMINE, LORazepam, morphine injection, polyvinyl alcohol No Known Allergies Review of Systems  Unable to perform ROS: Dementia   Physical Exam Constitutional:      Comments: Does not respond to voice or gentle touch  Pulmonary:     Effort: Pulmonary effort is normal.  Skin:    General: Skin is warm and dry.  Neurological:     Mental Status: She is disoriented.    Vital Signs: BP (!) 168/62   Pulse (!) 56   Temp 97.8 F (  36.6 C) (Oral)   Resp 17   Wt 48.4 kg Comment: However at [facility] her last weight in June 10th was 98.2 lbs and in April it was 99.4 lbs  SpO2 99%   BMI 17.76 kg/m  Pain Scale: PAINAD       SpO2: SpO2: 99 % O2 Device:SpO2: 99 % O2 Flow Rate: .   IO: Intake/output summary:  Intake/Output Summary (Last 24 hours) at 07/05/2021 1008 Last data filed at 07/05/2021 0930 Gross per 24 hour  Intake 500 ml  Output 900 ml  Net -400 ml    LBM:   Baseline Weight: Weight: 48.4 kg (However at [place] her last weight in June 10th was 98.2 lbs and in April it was 99.4 lbs) Most recent weight: Weight: 48.4 kg (However at [place] her last weight in June 10th was 98.2 lbs and  in April it was 99.4 lbs)     Palliative Assessment/Data: PPS 20%    Time Total: 75 minutes Greater than 50%  of this time was spent counseling and coordinating care related to the above assessment and plan.  Juel Burrow, DNP, AGNP-C Palliative Medicine Team [phone] Pager: [phone]

## 2021-07-05 NOTE — ED Notes (Signed)
Pt unable to sign for discharge d/t AMS

## 2021-07-05 NOTE — TOC Transition Note (Signed)
Transition of Care [facility]) - CM/SW Discharge Note   Patient Details  Name: Evelyn Taylor MRN: [id number] Date of Birth: [birth year]  [facility] CM/SW Contact:  Hetty Ely, RN Phone Number: 07/05/2021, 11:53 AM    Final next level of care: Long Term Nursing Home Barriers to Discharge: ED Barriers Resolved   Patient Goals and CMS Choice Patient states their goals for this hospitalization and ongoing recovery are:: Daughter request patient to return to [facility]      Discharge Placement                Patient to be transferred to facility by: [facility] Name of family member notified: Clayborn Bigness, daughter Patient and family notified of of transfer: 07/05/21  Discharge Plan and Services                                     Social Determinants of Health (SDOH) Interventions     Readmission Risk Interventions No flowsheet data found.

## 2021-07-05 NOTE — ED Notes (Signed)
Pt yelling for help- went to check on pt and covered her up with blankets

## 2021-07-05 NOTE — ED Notes (Signed)
Assisted daughter to boost pt in bed

## 2021-07-05 NOTE — Discharge Summary (Signed)
Physician Discharge Summary  Evelyn IshiharaReba Pena ZOX:[id number] DOB: 1921-01-17 DOA: 07/04/2021  PCP: Sharin MonsPegna, Guillaume J, MD  Admit date: 07/04/2021 Discharge date: 07/05/2021  Admitted From: [facility] Disposition:  [facility] with [facility]  Recommendations for Outpatient Follow-up:  Follow up with PCP as needed.  Home Health: None Equipment/Devices: None  Discharge Condition: Stable Code Status:   Code Status: DNR Diet recommendation:  Diet Order             DIET SOFT Room service appropriate? Yes; Fluid consistency: Thin  Diet effective now           Diet general                    Brief/Interim Summary:  87 year old female with PMH of Advanced Dementia, Major Depressive Disorder, Recurrent Falls and Chronic Debility (bedbound at baseline), Bilateral Blindness, Bilateral Sensorineural Hearing Loss, Chronic Hypertension, Diabetes Mellitus Type 2, Diastolic Heart Failure, Chronic Atrial Fibrillation (not on anticoagulation due to high fall risk), Chronic Kidney Disease Stage 3, Chronic COPD on home oxygen as needed, Atherosclerosis, and Osteoporosis who presents to the ED on 7/6 after experiencing an unwitnessed fall at her nursing facility.  Head CT showed a right frontal scalp hematoma and no intracranial pathology.  Cervical CT showed severe disc degenerative disease and no fracture or subluxation of the cervical spine.  Troponin levels were mildly elevated.  Of note, patient was admitted in 2015 for a mechanical fall with a negative cardiac workup.  After discussion with the family regarding overall physical status, disease trajectory, values and goals of care, and quality of life with two providers and a palliative care team - the family decided as per patient's wishes and living will to transition to comfort care.  Patient was [facility] with Hosp[facility] Services in stable condition.  Discharge  Diagnoses:  Principal Problem:   Scalp hematoma Active Problems:   Essential hypertension   Acute renal insufficiency   Dementia (HCC)   Atrial fibrillation, chronic (HCC)   Falls frequently   Elevated troponin   COPD with chronic bronchitis (HCC)   Type 2 diabetes mellitus with stage 3 chronic kidney disease (HCC)   Debility   DNR (do not resuscitate)  Consults: None  Subjective: [facility] under Hospice Services in stable condition.  Discharge Exam: Vitals:   07/05/21 1030 07/05/21 1100  BP:    Pulse: (!) 55 (!) 56  Resp: 15 15  Temp:    SpO2: 98% 100%   General: No acute distress Cardiovascular: RRR, S1/S2 +, no rubs, no gallops Respiratory: CTA bilaterally, no wheezing, no rhonchi Abdominal: Soft, NT, ND, bowel sounds + Extremities: no edema, no cyanosis  Discharge Instructions  Discharge Instructions     Diet general   Complete by: As directed    Increase activity slowly   Complete by: As directed       Allergies as of 07/05/2021   No Known Allergies      Medication List     STOP taking these medications    amLODipine 5 MG tablet Commonly known as: NORVASC   aspirin 81 MG tablet   atorvastatin 40 MG tablet Commonly known as: LIPITOR   brimonidine 0.2 % ophthalmic solution Commonly known as: ALPHAGAN   Calcium Carbonate-Vitamin D 600-400 MG-UNIT tablet   citalopram 20 MG tablet Commonly known as: CELEXA   ferrous sulfate 325 (65 FE)  MG tablet   Fluticasone-Salmeterol 250-50 MCG/DOSE Aepb Commonly known as: ADVAIR   guaiFENesin 100 MG/5ML liquid Commonly known as: ROBITUSSIN   multivitamin-lutein Caps capsule   omeprazole 20 MG capsule Commonly known as: PRILOSEC   tiotropium 18 MCG inhalation capsule Commonly known as: SPIRIVA       TAKE these medications    acetaminophen 325 MG tablet Commonly known as: TYLENOL Take 650 mg by mouth as needed.   mirtazapine 15 MG  tablet Commonly known as: REMERON Take 15 mg by mouth at bedtime.        No Known Allergies  The results of significant diagnostics from this hospitalization (including imaging, microbiology, ancillary and laboratory) are listed below for reference.    Microbiology: Recent Results (from the past 240 hour(s))  Resp Panel by RT-PCR (Flu A&B, Covid) Nasopharyngeal Swab     Status: None   Collection Time: 07/04/21  3:56 PM   Specimen: Nasopharyngeal Swab; Nasopharyngeal(NP) swabs in vial transport medium  Result Value Ref Range Status   SARS Coronavirus 2 by RT PCR NEGATIVE NEGATIVE Final    Comment: (NOTE) SARS-CoV-2 target nucleic acids are NOT DETECTED.  The SARS-CoV-2 RNA is generally detectable in upper respiratory specimens during the acute phase of infection. The lowest concentration of SARS-CoV-2 viral copies this assay can detect is 138 copies/mL. A negative result does not preclude SARS-Cov-2 infection and should not be used as the sole basis for treatment or other patient management decisions. A negative result may occur with  improper specimen collection/handling, submission of specimen other than nasopharyngeal swab, presence of viral mutation(s) within the areas targeted by this assay, and inadequate number of viral copies(<138 copies/mL). A negative result must be combined with clinical observations, patient history, and epidemiological information. The expected result is Negative.  Fact Sheet for Patients:  BloggerCourse.com  Fact Sheet for Healthcare Providers:  SeriousBroker.it  This test is no t yet approved or cleared by the [place] FDA and  has been authorized for detection and/or diagnosis of SARS-CoV-2 by FDA under an Emergency Use Authorization (EUA). This EUA will remain  in effect (meaning this test can be used) for the duration of the COVID-19 declaration under Section 564(b)(1) of the Act,  21 U.S.C.section 360bbb-3(b)(1), unless the authorization is terminated  or revoked sooner.       Influenza A by PCR NEGATIVE NEGATIVE Final   Influenza B by PCR NEGATIVE NEGATIVE Final    Comment: (NOTE) The Xpert Xpress SARS-CoV-2/FLU/RSV plus assay is intended as an aid in the diagnosis of influenza from Nasopharyngeal swab specimens and should not be used as a sole basis for treatment. Nasal washings and aspirates are unacceptable for Xpert Xpress SARS-CoV-2/FLU/RSV testing.  Fact Sheet for Patients: BloggerCourse.com  Fact Sheet for Healthcare Providers: SeriousBroker.it  This test is not yet approved or cleared by the [place] FDA and has been authorized for detection and/or diagnosis of SARS-CoV-2 by FDA under an Emergency Use Authorization (EUA). This EUA will remain in effect (meaning this test can be used) for the duration of the COVID-19 declaration under Section 564(b)(1) of the Act, 21 U.S.C. section 360bbb-3(b)(1), unless the authorization is terminated or revoked.  Performed at [facility], [address] [place], [place] [address]     Procedures/Studies: CT Head Wo Contrast  Result Date: 07/04/2021 CLINICAL DATA:  Fall from a recliner, right frontal hematoma EXAM: CT HEAD WITHOUT CONTRAST CT CERVICAL SPINE WITHOUT CONTRAST TECHNIQUE: Multidetector CT imaging of the head and cervical  spine was performed following the standard protocol without intravenous contrast. Multiplanar CT image reconstructions of the cervical spine were also generated. COMPARISON:  05/16/2016 FINDINGS: CT HEAD FINDINGS Brain: No evidence of acute infarction, hemorrhage, hydrocephalus, extra-axial collection or mass lesion/mass effect. Extensive periventricular and deep white matter hypodensity. Moderate global cerebral volume loss. Vascular: No hyperdense vessel or unexpected calcification. Skull: Normal. Negative for fracture  or focal lesion. Sinuses/Orbits: No acute finding. Other: Right frontal scalp hematoma. CT CERVICAL SPINE FINDINGS Alignment: Normal. Skull base and vertebrae: No acute fracture. No primary bone lesion or focal pathologic process. Soft tissues and spinal canal: No prevertebral fluid or swelling. No visible canal hematoma. Disc levels: Moderate to severe multilevel disc space height loss and osteophytosis, worst from C5 through C7. Upper chest: Negative. Other: None. IMPRESSION: 1. No acute intracranial pathology. 2. Advanced small-vessel white matter disease and global cerebral volume loss. 3. Right frontal scalp hematoma. 4. No fracture or static subluxation of the cervical spine. 5. Moderate to severe multilevel disc degenerative disease of the cervical spine. Electronically Signed   By: Lauralyn Primes M.D.   On: 07/04/2021 14:00   CT Cervical Spine Wo Contrast  Result Date: 07/04/2021 CLINICAL DATA:  Fall from a recliner, right frontal hematoma EXAM: CT HEAD WITHOUT CONTRAST CT CERVICAL SPINE WITHOUT CONTRAST TECHNIQUE: Multidetector CT imaging of the head and cervical spine was performed following the standard protocol without intravenous contrast. Multiplanar CT image reconstructions of the cervical spine were also generated. COMPARISON:  05/16/2016 FINDINGS: CT HEAD FINDINGS Brain: No evidence of acute infarction, hemorrhage, hydrocephalus, extra-axial collection or mass lesion/mass effect. Extensive periventricular and deep white matter hypodensity. Moderate global cerebral volume loss. Vascular: No hyperdense vessel or unexpected calcification. Skull: Normal. Negative for fracture or focal lesion. Sinuses/Orbits: No acute finding. Other: Right frontal scalp hematoma. CT CERVICAL SPINE FINDINGS Alignment: Normal. Skull base and vertebrae: No acute fracture. No primary bone lesion or focal pathologic process. Soft tissues and spinal canal: No prevertebral fluid or swelling. No visible canal hematoma. Disc  levels: Moderate to severe multilevel disc space height loss and osteophytosis, worst from C5 through C7. Upper chest: Negative. Other: None. IMPRESSION: 1. No acute intracranial pathology. 2. Advanced small-vessel white matter disease and global cerebral volume loss. 3. Right frontal scalp hematoma. 4. No fracture or static subluxation of the cervical spine. 5. Moderate to severe multilevel disc degenerative disease of the cervical spine. Electronically Signed   By: Lauralyn Primes M.D.   On: 07/04/2021 14:00   DG Chest Portable 1 View  Result Date: 07/04/2021 CLINICAL DATA:  fall EXAM: PORTABLE CHEST 1 VIEW COMPARISON:  Chest radiograph 05/16/2016 FINDINGS: Unchanged cardiomediastinal silhouette. There is no new focal airspace disease. There is no pleural effusion or visible pneumothorax. There is a skin fold overlying the left hemithorax. Diffuse osteopenia. Bilateral shoulder degenerative changes. There is no evidence of displaced rib fracture. IMPRESSION: No radiographic evidence of trauma in the chest. Electronically Signed   By: Caprice Renshaw   On: 07/04/2021 14:15    Labs:  Basic Metabolic Panel: Recent Labs  Lab 07/04/21 1456  NA 140  K 3.7  CL 103  CO2 27  GLUCOSE 103*  BUN 25*  CREATININE 0.75  CALCIUM 9.2    CBC: Recent Labs  Lab 07/04/21 1456  WBC 7.2  NEUTROABS 5.2  HGB 12.5  HCT 38.4  MCV 99.2  PLT 249    Urinalysis    Component Value Date/Time   COLORURINE YELLOW (A) 07/05/2021 2505  APPEARANCEUR CLEAR (A) 07/05/2021 0923   APPEARANCEUR Clear 11/13/2014 1317   LABSPEC 1.011 07/05/2021 0923   LABSPEC 1.016 11/13/2014 1317   PHURINE 6.0 07/05/2021 0923   GLUCOSEU NEGATIVE 07/05/2021 0923   GLUCOSEU Negative 11/13/2014 1317   HGBUR NEGATIVE 07/05/2021 0923   BILIRUBINUR NEGATIVE 07/05/2021 0923   BILIRUBINUR Negative 11/13/2014 1317   KETONESUR 5 (A) 07/05/2021 0923   PROTEINUR NEGATIVE 07/05/2021 0923   NITRITE NEGATIVE 07/05/2021 0923   LEUKOCYTESUR  NEGATIVE 07/05/2021 0923   LEUKOCYTESUR Negative 11/13/2014 1317    Microbiology Recent Results (from the past 240 hour(s))  Resp Panel by RT-PCR (Flu A&B, Covid) Nasopharyngeal Swab     Status: None   Collection Time: 07/04/21  3:56 PM   Specimen: Nasopharyngeal Swab; Nasopharyngeal(NP) swabs in vial transport medium  Result Value Ref Range Status   SARS Coronavirus 2 by RT PCR NEGATIVE NEGATIVE Final    Comment: (NOTE) SARS-CoV-2 target nucleic acids are NOT DETECTED.  The SARS-CoV-2 RNA is generally detectable in upper respiratory specimens during the acute phase of infection. The lowest concentration of SARS-CoV-2 viral copies this assay can detect is 138 copies/mL. A negative result does not preclude SARS-Cov-2 infection and should not be used as the sole basis for treatment or other patient management decisions. A negative result may occur with  improper specimen collection/handling, submission of specimen other than nasopharyngeal swab, presence of viral mutation(s) within the areas targeted by this assay, and inadequate number of viral copies(<138 copies/mL). A negative result must be combined with clinical observations, patient history, and epidemiological information. The expected result is Negative.  Fact Sheet for Patients:  BloggerCourse.com  Fact Sheet for Healthcare Providers:  SeriousBroker.it  This test is no t yet approved or cleared by the [place] FDA and  has been authorized for detection and/or diagnosis of SARS-CoV-2 by FDA under an Emergency Use Authorization (EUA). This EUA will remain  in effect (meaning this test can be used) for the duration of the COVID-19 declaration under Section 564(b)(1) of the Act, 21 U.S.C.section 360bbb-3(b)(1), unless the authorization is terminated  or revoked sooner.       Influenza A by PCR NEGATIVE NEGATIVE Final   Influenza B by PCR NEGATIVE NEGATIVE Final     Comment: (NOTE) The Xpert Xpress SARS-CoV-2/FLU/RSV plus assay is intended as an aid in the diagnosis of influenza from Nasopharyngeal swab specimens and should not be used as a sole basis for treatment. Nasal washings and aspirates are unacceptable for Xpert Xpress SARS-CoV-2/FLU/RSV testing.  Fact Sheet for Patients: BloggerCourse.com  Fact Sheet for Healthcare Providers: SeriousBroker.it  This test is not yet approved or cleared by the [place] FDA and has been authorized for detection and/or diagnosis of SARS-CoV-2 by FDA under an Emergency Use Authorization (EUA). This EUA will remain in effect (meaning this test can be used) for the duration of the COVID-19 declaration under Section 564(b)(1) of the Act, 21 U.S.C. section 360bbb-3(b)(1), unless the authorization is terminated or revoked.  Performed at [facility], [address], [place], [place] [address]     Time coordinating discharge: >30 minutes  SIGNED: Baldwin Jamaica, MD  [facility] 07/05/2021, 12:00 PM  If 7PM-7AM, please contact night-coverage [email or web address]

## 2021-07-05 NOTE — ED Notes (Signed)
Bed exit on, pt asking about how she can get home and get to bed, advised to patient that she is in a bed and will be staying the night in the hospital. Pt alert to self only at this time.

## 2021-07-05 NOTE — TOC Progression Note (Signed)
Transition of Care [facility]) - Progression Note    Patient Details  Name: Evelyn Taylor MRN: [id number] Date of Birth: 1921-12-15  [facility] CM/SW Contact  Hetty Ely, RN Phone Number: 07/05/2021, 11:54 AM  Clinical Narrative:  Called [facility] to inquire about discharge back to the facility with Hospice, spoke with Kennyth Arnold Director who says patient can return to [facility] her previous room and they will make arrangements for Hospice care, the Attending need to put referral in discharge orders. Attending notified and also discussed with daughter who agrees with Hospice care.       Barriers to Discharge: ED Barriers Resolved  Expected Discharge Plan and Services           Expected Discharge Date: 07/05/21                                     Social Determinants of Health (SDOH) Interventions    Readmission Risk Interventions No flowsheet data found.

## 2021-07-05 NOTE — ED Notes (Signed)
Pt heard at nurse's station frequently yelling, "help me", pt sitting up in bed. Pt keeps saying "help me get into a bed, any bed" attempted to redirect patient. Pt alert to self only.

## 2021-07-05 NOTE — ED Notes (Signed)
[facility] called for transport back to [facility] 

## 2021-10-30 DEATH — deceased
